# Patient Record
Sex: Female | Born: 1970 | Race: White | Hispanic: No | Smoking: Current every day smoker
Health system: Southern US, Community
[De-identification: ages and names within clinical notes are randomized; demographics above are authoritative.]

## PROBLEM LIST (undated history)

## (undated) DIAGNOSIS — E785 Hyperlipidemia, unspecified: Secondary | ICD-10-CM

## (undated) DIAGNOSIS — K219 Gastro-esophageal reflux disease without esophagitis: Secondary | ICD-10-CM

## (undated) DIAGNOSIS — E1121 Type 2 diabetes mellitus with diabetic nephropathy: Secondary | ICD-10-CM

## (undated) DIAGNOSIS — L03311 Cellulitis of abdominal wall: Secondary | ICD-10-CM

## (undated) DIAGNOSIS — E039 Hypothyroidism, unspecified: Secondary | ICD-10-CM

## (undated) DIAGNOSIS — N189 Chronic kidney disease, unspecified: Secondary | ICD-10-CM

## (undated) DIAGNOSIS — Z720 Tobacco use: Secondary | ICD-10-CM

## (undated) DIAGNOSIS — I10 Essential (primary) hypertension: Secondary | ICD-10-CM

## (undated) DIAGNOSIS — F32A Depression, unspecified: Secondary | ICD-10-CM

## (undated) DIAGNOSIS — F329 Major depressive disorder, single episode, unspecified: Secondary | ICD-10-CM

## (undated) DIAGNOSIS — Z803 Family history of malignant neoplasm of breast: Secondary | ICD-10-CM

## (undated) DIAGNOSIS — R809 Proteinuria, unspecified: Secondary | ICD-10-CM

## (undated) DIAGNOSIS — F419 Anxiety disorder, unspecified: Secondary | ICD-10-CM

## (undated) DIAGNOSIS — K589 Irritable bowel syndrome without diarrhea: Secondary | ICD-10-CM

## (undated) HISTORY — DX: Essential (primary) hypertension: I10

## (undated) HISTORY — DX: Irritable bowel syndrome, unspecified: K58.9

## (undated) HISTORY — DX: Morbid (severe) obesity due to excess calories: E66.01

## (undated) HISTORY — DX: Depression, unspecified: F32.A

## (undated) HISTORY — DX: Major depressive disorder, single episode, unspecified: F32.9

## (undated) HISTORY — DX: Hypothyroidism, unspecified: E03.9

## (undated) HISTORY — DX: Gastro-esophageal reflux disease without esophagitis: K21.9

## (undated) HISTORY — DX: Tobacco use: Z72.0

## (undated) HISTORY — DX: Chronic kidney disease, unspecified: N18.9

## (undated) HISTORY — DX: Hyperlipidemia, unspecified: E78.5

## (undated) HISTORY — DX: Type 2 diabetes mellitus with diabetic nephropathy: E11.21

## (undated) HISTORY — DX: Family history of malignant neoplasm of breast: Z80.3

## (undated) HISTORY — PX: OTHER SURGICAL HISTORY: SHX169

---

## 2003-03-27 HISTORY — PX: CHOLECYSTECTOMY: SHX55

## 2005-02-18 ENCOUNTER — Other Ambulatory Visit: Payer: Self-pay

## 2005-02-18 ENCOUNTER — Emergency Department: Payer: Self-pay | Admitting: Emergency Medicine

## 2005-09-12 ENCOUNTER — Emergency Department: Payer: Self-pay | Admitting: Emergency Medicine

## 2007-09-12 ENCOUNTER — Ambulatory Visit: Payer: Self-pay | Admitting: Internal Medicine

## 2008-05-11 ENCOUNTER — Ambulatory Visit: Payer: Self-pay | Admitting: Internal Medicine

## 2008-08-22 ENCOUNTER — Emergency Department: Payer: Self-pay | Admitting: Emergency Medicine

## 2008-08-23 ENCOUNTER — Emergency Department: Payer: Self-pay | Admitting: Emergency Medicine

## 2009-03-26 HISTORY — PX: COLONOSCOPY: SHX174

## 2009-11-07 ENCOUNTER — Ambulatory Visit: Payer: Self-pay | Admitting: Unknown Physician Specialty

## 2010-03-26 HISTORY — PX: OTHER SURGICAL HISTORY: SHX169

## 2010-07-25 ENCOUNTER — Ambulatory Visit: Payer: Self-pay | Admitting: Urology

## 2010-07-31 ENCOUNTER — Ambulatory Visit: Payer: Self-pay | Admitting: Urology

## 2010-08-03 ENCOUNTER — Ambulatory Visit: Payer: Self-pay | Admitting: Urology

## 2013-02-23 ENCOUNTER — Ambulatory Visit: Payer: Self-pay | Admitting: Family Medicine

## 2014-01-26 ENCOUNTER — Emergency Department: Payer: Self-pay | Admitting: Emergency Medicine

## 2014-01-26 DIAGNOSIS — E785 Hyperlipidemia, unspecified: Secondary | ICD-10-CM | POA: Insufficient documentation

## 2014-01-26 DIAGNOSIS — Z8669 Personal history of other diseases of the nervous system and sense organs: Secondary | ICD-10-CM | POA: Insufficient documentation

## 2014-01-26 DIAGNOSIS — F17200 Nicotine dependence, unspecified, uncomplicated: Secondary | ICD-10-CM | POA: Insufficient documentation

## 2014-01-26 DIAGNOSIS — E1129 Type 2 diabetes mellitus with other diabetic kidney complication: Secondary | ICD-10-CM | POA: Insufficient documentation

## 2014-01-26 DIAGNOSIS — Z87442 Personal history of urinary calculi: Secondary | ICD-10-CM | POA: Insufficient documentation

## 2014-01-26 DIAGNOSIS — E039 Hypothyroidism, unspecified: Secondary | ICD-10-CM | POA: Insufficient documentation

## 2014-01-26 DIAGNOSIS — I10 Essential (primary) hypertension: Secondary | ICD-10-CM | POA: Insufficient documentation

## 2014-01-26 DIAGNOSIS — R809 Proteinuria, unspecified: Secondary | ICD-10-CM | POA: Insufficient documentation

## 2014-07-20 ENCOUNTER — Emergency Department
Admit: 2014-07-20 | Disposition: A | Discharge status: Home / Self Care | Payer: Self-pay | Admitting: Emergency Medicine

## 2014-07-20 LAB — CBC
HCT: 41 % (ref 35.0–47.0)
HGB: 13.4 g/dL (ref 12.0–16.0)
MCH: 27.4 pg (ref 26.0–34.0)
MCHC: 32.6 g/dL (ref 32.0–36.0)
MCV: 84 fL (ref 80–100)
PLATELETS: 262 10*3/uL (ref 150–440)
RBC: 4.88 10*6/uL (ref 3.80–5.20)
RDW: 17.8 % — ABNORMAL HIGH (ref 11.5–14.5)
WBC: 11 10*3/uL (ref 3.6–11.0)

## 2014-07-20 LAB — COMPREHENSIVE METABOLIC PANEL
ALT: 21 U/L
AST: 18 U/L
Albumin: 3.4 g/dL — ABNORMAL LOW
Alkaline Phosphatase: 130 U/L — ABNORMAL HIGH
Anion Gap: 8 (ref 7–16)
BUN: 17 mg/dL
Bilirubin,Total: 0.6 mg/dL
CALCIUM: 9.2 mg/dL
CHLORIDE: 107 mmol/L
CO2: 26 mmol/L
Creatinine: 1.15 mg/dL — ABNORMAL HIGH
EGFR (Non-African Amer.): 59 — ABNORMAL LOW
GLUCOSE: 179 mg/dL — AB
POTASSIUM: 4 mmol/L
Sodium: 141 mmol/L
Total Protein: 7 g/dL

## 2014-07-20 LAB — TROPONIN I: Troponin-I: <0.03 ng/mL

## 2014-07-20 LAB — CK TOTAL AND CKMB (NOT AT ARMC)
CK, Total: 62 U/L
CK-MB: 1.9 ng/mL

## 2014-07-20 LAB — D-DIMER(ARMC): D-DIMER: 432 ng/mL

## 2014-07-21 ENCOUNTER — Encounter: Payer: Self-pay | Admitting: *Deleted

## 2014-07-26 ENCOUNTER — Ambulatory Visit (INDEPENDENT_AMBULATORY_CARE_PROVIDER_SITE_OTHER): Payer: 59 | Admitting: General Surgery

## 2014-07-26 ENCOUNTER — Encounter: Payer: Self-pay | Admitting: General Surgery

## 2014-07-26 VITALS — BP 122/68 | HR 76 | Resp 14 | Ht 61.0 in | Wt 291.0 lb

## 2014-07-26 DIAGNOSIS — L0231 Cutaneous abscess of buttock: Secondary | ICD-10-CM

## 2014-07-26 NOTE — Patient Instructions (Addendum)
Moist heat to area 3 x daily. Finish current course of ABX. Patient to return in two weeks

## 2014-07-26 NOTE — Progress Notes (Signed)
Patient ID: Kendra Anderson, female   DOB: 01/27/1971, 44 y.o.   MRN: [id number]  Chief Complaint  Patient presents with  . Other    pilonidal cyst    HPI Kendra NakayamaKimberly D Anderson is a 44 y.o. female here today for a evaluation of a pilonidal cyst . Patient states she noticed about two weeks ago. She went to her primary doctors on 07/22/14 and they drained the area and out-started  her on amoxicillin 875. She states the area is still draining. Patient states there is another spot above the rea that they drained. HPI  Past Medical History  Diagnosis Date  . Diabetes mellitus without complication   . Hypertension   . Hyperlipidemia   . GERD (gastroesophageal reflux disease)   . Chronic kidney disease     Past Surgical History  Procedure Laterality Date  . Kidney stones removed  2012  . Cholecystectomy  2005  . Colonoscopy  2011    Dr. Mechele Collinelliott    History reviewed. No pertinent family history.  Social History History  Substance Use Topics  . Smoking status: Current Every Day Smoker -- 1.00 packs/day for 12 years    Types: Cigarettes  . Smokeless tobacco: Not on file  . Alcohol Use: No    No Known Allergies  Current Outpatient Prescriptions  Medication Sig Dispense Refill  . amLODipine (NORVASC) 10 MG tablet     . amoxicillin-clavulanate (AUGMENTIN) 875-125 MG per tablet     . atorvastatin (LIPITOR) 20 MG tablet     . enalapril (VASOTEC) 10 MG tablet     . fluticasone (FLONASE) 50 MCG/ACT nasal spray     . glimepiride (AMARYL) 2 MG tablet     . LEVEMIR FLEXTOUCH 100 UNIT/ML Pen     . levothyroxine (SYNTHROID, LEVOTHROID) 112 MCG tablet     . metFORMIN (GLUCOPHAGE) 1000 MG tablet     . NOVOLOG FLEXPEN 100 UNIT/ML FlexPen     . omeprazole (PRILOSEC) 40 MG capsule     . VICTOZA 18 MG/3ML SOPN      No current facility-administered medications for this visit.    Review of Systems Review of Systems  Constitutional: Negative.   Respiratory: Negative.   Cardiovascular:  Negative.     Blood pressure 122/68, pulse 76, resp. rate 14, height 5\' 1"  (1.549 m), weight 291 lb (131.997 kg), last menstrual period 06/28/2014.  Physical Exam Physical Exam  Constitutional: She is oriented to person, place, and time. She appears well-developed and well-nourished.  Neurological: She is alert and oriented to person, place, and time.  Skin:      Right upper gluteal abscess about 4 cm size  Data Reviewed none  Assessment    4cm right gluteal abscess, appears to be draining well.     Plan    Continue with current ABX. Local heat. Patient to return 2 weeks.        SANKAR,SEEPLAPUTHUR G 07/26/2014, 1:13 PM

## 2014-08-09 ENCOUNTER — Encounter: Payer: Self-pay | Admitting: General Surgery

## 2014-08-09 ENCOUNTER — Ambulatory Visit (INDEPENDENT_AMBULATORY_CARE_PROVIDER_SITE_OTHER): Payer: 59 | Admitting: General Surgery

## 2014-08-09 VITALS — BP 120/68 | HR 76 | Resp 14 | Ht 61.0 in | Wt 261.0 lb

## 2014-08-09 DIAGNOSIS — L0231 Cutaneous abscess of buttock: Secondary | ICD-10-CM

## 2014-08-09 NOTE — Patient Instructions (Signed)
Patient to return in one month.  The patient is aware to use a heating pad as needed for comfort. 

## 2014-08-09 NOTE — Progress Notes (Signed)
Patient ID: Kendra Anderson, female   DOB: 02/08/1971, 44 y.o.   MRN: [id number]  Chief Complaint  Patient presents with  . Follow-up    gluteal abscess    HPI Kendra Anderson is a 44 y.o. female here today for her follow up gluteal abscess . Patient states the area is doing well. No more drainage.  HPI  Past Medical History  Diagnosis Date  . Diabetes mellitus without complication   . Hypertension   . Hyperlipidemia   . GERD (gastroesophageal reflux disease)   . Chronic kidney disease     Past Surgical History  Procedure Laterality Date  . Kidney stones removed  2012  . Cholecystectomy  2005  . Colonoscopy  2011    Dr. Mechele Collinelliott    History reviewed. No pertinent family history.  Social History History  Substance Use Topics  . Smoking status: Current Every Day Smoker -- 1.00 packs/day for 12 years    Types: Cigarettes  . Smokeless tobacco: Not on file  . Alcohol Use: No    No Known Allergies  Current Outpatient Prescriptions  Medication Sig Dispense Refill  . amLODipine (NORVASC) 10 MG tablet     . amoxicillin-clavulanate (AUGMENTIN) 875-125 MG per tablet     . atorvastatin (LIPITOR) 20 MG tablet     . enalapril (VASOTEC) 10 MG tablet     . fluticasone (FLONASE) 50 MCG/ACT nasal spray     . glimepiride (AMARYL) 2 MG tablet     . LEVEMIR FLEXTOUCH 100 UNIT/ML Pen     . levothyroxine (SYNTHROID, LEVOTHROID) 112 MCG tablet     . metFORMIN (GLUCOPHAGE) 1000 MG tablet     . NOVOLOG FLEXPEN 100 UNIT/ML FlexPen     . omeprazole (PRILOSEC) 40 MG capsule     . VICTOZA 18 MG/3ML SOPN      No current facility-administered medications for this visit.    Review of Systems Review of Systems  Constitutional: Negative.   Respiratory: Negative.   Cardiovascular: Negative.     Blood pressure 120/68, pulse 76, resp. rate 14, height 5\' 1"  (1.549 m), weight 261 lb (118.389 kg), last menstrual period 06/28/2014.  Physical Exam Physical Exam In right upper gluteal  area there is no redness or skin induration. Drainage site is healed. Above there is a 3by2 cm firm subcutaneous mass, non tender.Marland Kitchen.other findings in the gluteal region No  Data Reviewed None  Assessment    Gluteal abscess- likely resolving inflammation, no abscess noted.     Plan       Patient to return in three to four weeks.     Caidon Foti G 08/09/2014, 10:34 AM

## 2014-09-02 ENCOUNTER — Encounter: Payer: Self-pay | Admitting: General Surgery

## 2014-09-02 ENCOUNTER — Ambulatory Visit (INDEPENDENT_AMBULATORY_CARE_PROVIDER_SITE_OTHER): Payer: 59 | Admitting: General Surgery

## 2014-09-02 VITALS — BP 142/78 | HR 72 | Resp 14 | Ht 61.0 in | Wt 265.0 lb

## 2014-09-02 DIAGNOSIS — L0231 Cutaneous abscess of buttock: Secondary | ICD-10-CM

## 2014-09-02 MED ORDER — SULFAMETHOXAZOLE-TRIMETHOPRIM 800-160 MG PO TABS: 1.0000 | ORAL_TABLET | Freq: Two times a day (BID) | ORAL | Status: DC

## 2014-09-02 NOTE — Progress Notes (Signed)
Patient first seen for abscess on 07/26/14 was doing better. Patient states the abscess increased in size two days ago, had low grade fever and chills. She started to feel better last night after it drained. She feels a "knot" in the area of abscess.  2 cm induration on the right gluteal region in the lower inner quadrant. Induration has soft middle and 5 mm of thinned out skin, which was opened with a needle. No pus seen.   Impression: recurrent small abscess in gluteal area. Appears to have drained fully. Covered with dry gauze.  Rx for Septra DS 1 BID for 1 week.   Follow up as needed.   PCP: Mila Merry

## 2014-09-16 ENCOUNTER — Ambulatory Visit: Payer: 59 | Admitting: General Surgery

## 2014-11-01 ENCOUNTER — Encounter: Payer: Self-pay | Admitting: Family Medicine

## 2014-11-01 ENCOUNTER — Ambulatory Visit (INDEPENDENT_AMBULATORY_CARE_PROVIDER_SITE_OTHER): Payer: 59 | Admitting: Family Medicine

## 2014-11-01 VITALS — BP 112/70 | HR 80 | Temp 98.8°F | Resp 17 | Ht 61.0 in | Wt 252.0 lb

## 2014-11-01 DIAGNOSIS — L03317 Cellulitis of buttock: Secondary | ICD-10-CM

## 2014-11-01 MED ORDER — AMOXICILLIN-POT CLAVULANATE 875-125 MG PO TABS: 1.0000 | ORAL_TABLET | Freq: Two times a day (BID) | ORAL | Status: DC

## 2014-11-01 NOTE — Progress Notes (Signed)
       Patient: Kendra Anderson Female    DOB: Oct 08, 1970   44 y.o.   MRN: [id number] Visit Date: 11/01/2014  Today's Provider: Mila Merry, MD   Chief Complaint  Patient presents with  . Recurrent Skin Infections   Subjective:    HPI  Recurrent boil on right buttock, patient was seen 07/22/2014 by Molly Maduro for boil. Boil was lanced and prescribed Augmentin Culture grew Klebsiella pneumonia, and lesions quickly resolved with antibiotic. Boil became infected again 1 week ago. Patient has had low-grade fever with chills.    No Known Allergies Previous Medications   AMLODIPINE (NORVASC) 10 MG TABLET    10 mg.    ATORVASTATIN (LIPITOR) 20 MG TABLET    Take 20 mg by mouth daily.    ENALAPRIL (VASOTEC) 10 MG TABLET    Take 10 mg by mouth daily.    FLUTICASONE (FLONASE) 50 MCG/ACT NASAL SPRAY       GLIMEPIRIDE (AMARYL) 2 MG TABLET    Take 2 mg by mouth 2 (two) times daily.    LEVEMIR FLEXTOUCH 100 UNIT/ML PEN       LEVOTHYROXINE (SYNTHROID, LEVOTHROID) 112 MCG TABLET    Take 112 mcg by mouth daily before breakfast.    METFORMIN (GLUCOPHAGE) 1000 MG TABLET    Take 1,000 mg by mouth 2 (two) times daily with a meal.    MULTIPLE VITAMINS-MINERALS (MULTIVITAL PO)    Take 1 capsule by mouth daily.   NOVOLOG FLEXPEN 100 UNIT/ML FLEXPEN       OMEPRAZOLE (PRILOSEC) 40 MG CAPSULE    Take 40 mg by mouth daily.    VICTOZA 18 MG/3ML SOPN        Review of Systems  Constitutional: Positive for fever and chills.  Cardiovascular: Negative for chest pain and palpitations.  Neurological: Negative for dizziness, light-headedness and headaches.    History  Substance Use Topics  . Smoking status: Current Every Day Smoker -- 1.00 packs/day for 12 years    Types: Cigarettes  . Smokeless tobacco: Not on file  . Alcohol Use: No   Objective:   BP 112/70 mmHg  Pulse 80  Temp(Src) 98.8 F (37.1 C) (Oral)  Resp 17  Ht  (1.549 m)  Wt 252 lb (114.306 kg)  BMI 47.64 kg/m2  SpO2 100%  Physical  Exam  About 1cm shallow erosion with small amount yellow drainage right buttocks with about 1mm surrounding erythema.  .    Assessment & Plan:     1. Cellulitis of buttock  - amoxicillin-clavulanate (AUGMENTIN) 875-125 MG per tablet; Take 1 tablet by mouth 2 (two) times daily.  Dispense: 20 tablet; Refill: 0        Mila Merry, MD  [facility]

## 2014-12-17 DIAGNOSIS — N183 Chronic kidney disease, stage 3 unspecified: Secondary | ICD-10-CM | POA: Insufficient documentation

## 2015-04-27 ENCOUNTER — Encounter: Payer: Self-pay | Admitting: Family Medicine

## 2015-04-27 ENCOUNTER — Ambulatory Visit (INDEPENDENT_AMBULATORY_CARE_PROVIDER_SITE_OTHER): Payer: 59 | Admitting: Family Medicine

## 2015-04-27 ENCOUNTER — Telehealth: Payer: Self-pay | Admitting: Family Medicine

## 2015-04-27 VITALS — BP 110/60 | HR 83 | Temp 97.7°F | Resp 16 | Ht 61.0 in | Wt 237.0 lb

## 2015-04-27 DIAGNOSIS — L02211 Cutaneous abscess of abdominal wall: Secondary | ICD-10-CM | POA: Diagnosis not present

## 2015-04-27 DIAGNOSIS — R109 Unspecified abdominal pain: Secondary | ICD-10-CM | POA: Diagnosis not present

## 2015-04-27 MED ORDER — AMOXICILLIN-POT CLAVULANATE 875-125 MG PO TABS: 1.0000 | ORAL_TABLET | Freq: Two times a day (BID) | ORAL | Status: DC

## 2015-04-27 NOTE — Telephone Encounter (Signed)
Confirmed that pharmacy did receive rx. Patient notified.

## 2015-04-27 NOTE — Telephone Encounter (Signed)
Pt came in this am and thought she was getting a rx for Augmentin to be sent to [address] but she says nothing has been sent over yet.  Please advise.  Her call back is [phone]  Thanks Barth Kirks

## 2015-04-27 NOTE — Telephone Encounter (Signed)
Please review. Thanks!  

## 2015-04-27 NOTE — Telephone Encounter (Signed)
Please have her check again, rx should be there.

## 2015-04-27 NOTE — Progress Notes (Signed)
       Patient: Kendra Anderson Female    DOB: 1970-05-22   45 y.o.   MRN: [id number] Visit Date: 04/27/2015  Today's Provider: Mila Merry, MD   Chief Complaint  Patient presents with  . Cyst   Subjective:    HPI  Patient noticed boil on right side of her lower belly, pelvic area, 04/25/15 . Boil is the size of a walnut, red and is painful, and getting larger Patient reports having low-grade fever, body aches and chills since 04/25/2015. She states she has several similar lesions in the past which usually require incision and drainage.   No Known Allergies Previous Medications   AMLODIPINE (NORVASC) 10 MG TABLET    10 mg.    ATORVASTATIN (LIPITOR) 20 MG TABLET    Take 20 mg by mouth daily.    ENALAPRIL (VASOTEC) 10 MG TABLET    Take 10 mg by mouth daily.    GLIMEPIRIDE (AMARYL) 2 MG TABLET    Take 2 mg by mouth 2 (two) times daily.    LEVOTHYROXINE (SYNTHROID, LEVOTHROID) 112 MCG TABLET    Take 112 mcg by mouth daily before breakfast.    METFORMIN (GLUCOPHAGE) 1000 MG TABLET    Take 1,000 mg by mouth 2 (two) times daily with a meal.    MULTIPLE VITAMINS-MINERALS (MULTIVITAL PO)    Take 1 capsule by mouth daily.   OMEPRAZOLE (PRILOSEC) 40 MG CAPSULE    Take 40 mg by mouth daily.    VICTOZA 18 MG/3ML SOPN        Review of Systems  Constitutional: Positive for fever and chills. Negative for appetite change and fatigue.  Respiratory: Negative for chest tightness and shortness of breath.   Cardiovascular: Negative for chest pain and palpitations.  Gastrointestinal: Negative for nausea, vomiting and abdominal pain.  Musculoskeletal: Positive for myalgias.  Neurological: Negative for dizziness and weakness.    Social History  Substance Use Topics  . Smoking status: Current Every Day Smoker -- 1.00 packs/day for 12 years    Types: Cigarettes  . Smokeless tobacco: Not on file  . Alcohol Use: No   Objective:   BP 110/60 mmHg  Pulse 83  Temp(Src) 97.7 F (36.5 C) (Oral)   Resp 16  Ht  (1.549 m)  Wt 237 lb (107.502 kg)  BMI 44.80 kg/m2  SpO2 100%  LMP 04/13/2015  Physical Exam  Integ: About 4cm x 3 cm red cystic lesion pannus of right side of abdomen, no drainage. Very tender.     Assessment & Plan:     1. Abscess of abdominal wall Prepped with isopropyl alcohol. Anesthetized with SQ injection of 2% Lidocaine with epinephrine. Made 1cm incision allowing copious purulent material to drain and packed with 1cm long piece of iodoform guaze which she is to removed in 1-2 days.       Call if no rapidly improving.   Mila Merry, MD  [facility]

## 2015-12-05 ENCOUNTER — Encounter: Payer: Self-pay | Admitting: Family Medicine

## 2015-12-05 ENCOUNTER — Ambulatory Visit (INDEPENDENT_AMBULATORY_CARE_PROVIDER_SITE_OTHER): Payer: 59 | Admitting: Family Medicine

## 2015-12-05 VITALS — BP 110/64 | HR 84 | Temp 98.3°F | Resp 18 | Wt 229.0 lb

## 2015-12-05 DIAGNOSIS — L02415 Cutaneous abscess of right lower limb: Secondary | ICD-10-CM | POA: Diagnosis not present

## 2015-12-05 MED ORDER — AMOXICILLIN-POT CLAVULANATE 875-125 MG PO TABS: 1.0000 | ORAL_TABLET | Freq: Two times a day (BID) | ORAL | 0 refills | Status: AC

## 2015-12-05 NOTE — Progress Notes (Signed)
Patient: Kendra Anderson Female    DOB: 21-Sep-1970   45 y.o.   MRN: [id number] Visit Date: 12/05/2015  Today's Provider: Mila Merry, MD   Chief Complaint  Patient presents with  . Mass   Subjective:    HPI  Mass:  Patient comes in today reporting she has a boil on her right inner thigh that appeared 4 days ago. Patient states the mass is extremely painful. Patient denies any drainage, but report the mass is red and purple colored. Patient has tried sitting in a bath tub and warm compresses with no relief.  Patient has had low grade fever and chills. She has had several swollen sebaceous cysts in the past requiring I&D and she wishes to proceed with I & D today.      Allergies  Allergen Reactions  . Isometheptene-Dichloral-Apap Hives    MIDRIN     Current Outpatient Prescriptions:  .  amLODipine (NORVASC) 10 MG tablet, 10 mg. , Disp: , Rfl:  .  atorvastatin (LIPITOR) 20 MG tablet, Take 20 mg by mouth daily. , Disp: , Rfl:  .  enalapril (VASOTEC) 10 MG tablet, Take 10 mg by mouth daily. , Disp: , Rfl:  .  glimepiride (AMARYL) 2 MG tablet, Take 2 mg by mouth 2 (two) times daily. , Disp: , Rfl:  .  Insulin Human (INSULIN PUMP) SOLN, Inject into the skin., Disp: , Rfl:  .  levothyroxine (SYNTHROID, LEVOTHROID) 112 MCG tablet, Take 112 mcg by mouth daily before breakfast. , Disp: , Rfl:  .  metFORMIN (GLUCOPHAGE) 1000 MG tablet, Take 1,000 mg by mouth 2 (two) times daily with a meal. , Disp: , Rfl:  .  Multiple Vitamins-Minerals (MULTIVITAL PO), Take 1 capsule by mouth daily., Disp: , Rfl:  .  omeprazole (PRILOSEC) 40 MG capsule, Take 40 mg by mouth daily. , Disp: , Rfl:  .  VICTOZA 18 MG/3ML SOPN, , Disp: , Rfl:   Current Outpatient Prescriptions  Medication Sig Dispense Refill  . amLODipine (NORVASC) 10 MG tablet 10 mg.     . atorvastatin (LIPITOR) 20 MG tablet Take 20 mg by mouth daily.     . enalapril (VASOTEC) 10 MG tablet Take 10 mg by mouth daily.     Marland Kitchen  glimepiride (AMARYL) 2 MG tablet Take 2 mg by mouth 2 (two) times daily.     . Insulin Human (INSULIN PUMP) SOLN Inject into the skin.    Marland Kitchen levothyroxine (SYNTHROID, LEVOTHROID) 112 MCG tablet Take 112 mcg by mouth daily before breakfast.     . metFORMIN (GLUCOPHAGE) 1000 MG tablet Take 1,000 mg by mouth 2 (two) times daily with a meal.     . Multiple Vitamins-Minerals (MULTIVITAL PO) Take 1 capsule by mouth daily.    Marland Kitchen omeprazole (PRILOSEC) 40 MG capsule Take 40 mg by mouth daily.     Marland Kitchen VICTOZA 18 MG/3ML SOPN      No current facility-administered medications for this visit.     Review of Systems  Constitutional: Positive for chills and fever. Negative for appetite change and fatigue.  Respiratory: Negative for chest tightness and shortness of breath.   Cardiovascular: Negative for chest pain and palpitations.  Gastrointestinal: Negative for abdominal pain, nausea and vomiting.  Skin: Positive for color change.       Mass on inner thigh if right leg  Neurological: Negative for dizziness and weakness.    Social History  Substance Use Topics  . Smoking status:  Current Every Day Smoker    Packs/day: 0.50    Years: 12.00    Types: Cigarettes  . Smokeless tobacco: Never Used  . Alcohol use No   Objective:   BP 110/64 (BP Location: Left Arm, Patient Position: Sitting, Cuff Size: Large)   Pulse 84   Temp 98.3 F (36.8 C) (Oral)   Resp 18   Wt 229 lb (103.9 kg)   SpO2 100% Comment: room air  BMI 43.27 kg/m   Physical Exam  General appearance: alert, well developed, well nourished, cooperative and in no distress Integ: grapes sized tender red cystic mass right inner thigh with scabbed area over previous area of drainage.     Assessment & Plan:     1. Abscess of right thigh Prepped with isopropyl alcohol. Anesthetized with 1% lidocaine with epinephrine. Made a 1cm incision and expressed copious purulent material from abscess. Packed with iodoform gauze and applied pressure  dressing.Patient tolerated well with minimal bleeding. Advised to keep area clean and dry for next 24 hours. Replace dressing if it becomes soaked with blood or wet. Return in 2-3 days for packing change.   - amoxicillin-clavulanate (AUGMENTIN) 875-125 MG tablet; Take 1 tablet by mouth 2 (two) times daily.  Dispense: 14 tablet; Refill: 0       Mila Merryonald Delan Ksiazek, MD  [facility]

## 2015-12-07 ENCOUNTER — Ambulatory Visit (INDEPENDENT_AMBULATORY_CARE_PROVIDER_SITE_OTHER): Payer: 59 | Admitting: Family Medicine

## 2015-12-07 ENCOUNTER — Encounter: Payer: Self-pay | Admitting: Family Medicine

## 2015-12-07 VITALS — BP 102/62 | HR 80 | Temp 97.8°F | Resp 16 | Wt 231.0 lb

## 2015-12-07 DIAGNOSIS — L02415 Cutaneous abscess of right lower limb: Secondary | ICD-10-CM

## 2015-12-07 NOTE — Progress Notes (Signed)
       Patient: Kendra Anderson Female    DOB: 03/01/1971   45 y.o.   MRN: [id number] Visit Date: 12/07/2015  Today's Provider: Mila Merryonald Bonnye Halle, MD   Chief Complaint  Patient presents with  . Follow-up    abscess   Subjective:    HPI  Follow up for Abscess  The patient was last seen for this 2 days ago. Changes made at last visit include Performed I & D. Packed with iodoform gauze and applied pressure dressing. Prescribed augmentin BID She reports excellent compliance with treatment. She feels that condition is Improved. She is not having side effects.   ------------------------------------------------------------------------------------       Allergies  Allergen Reactions  . Isometheptene-Dichloral-Apap Hives    MIDRIN     Current Outpatient Prescriptions:  .  amLODipine (NORVASC) 10 MG tablet, 10 mg. , Disp: , Rfl:  .  amoxicillin-clavulanate (AUGMENTIN) 875-125 MG tablet, Take 1 tablet by mouth 2 (two) times daily., Disp: 14 tablet, Rfl: 0 .  atorvastatin (LIPITOR) 20 MG tablet, Take 20 mg by mouth daily. , Disp: , Rfl:  .  enalapril (VASOTEC) 10 MG tablet, Take 10 mg by mouth daily. , Disp: , Rfl:  .  glimepiride (AMARYL) 2 MG tablet, Take 2 mg by mouth 2 (two) times daily. , Disp: , Rfl:  .  Insulin Human (INSULIN PUMP) SOLN, Inject into the skin., Disp: , Rfl:  .  levothyroxine (SYNTHROID, LEVOTHROID) 112 MCG tablet, Take 112 mcg by mouth daily before breakfast. , Disp: , Rfl:  .  metFORMIN (GLUCOPHAGE) 1000 MG tablet, Take 1,000 mg by mouth 2 (two) times daily with a meal. , Disp: , Rfl:  .  Multiple Vitamins-Minerals (MULTIVITAL PO), Take 1 capsule by mouth daily., Disp: , Rfl:  .  omeprazole (PRILOSEC) 40 MG capsule, Take 40 mg by mouth daily. , Disp: , Rfl:  .  VICTOZA 18 MG/3ML SOPN, , Disp: , Rfl:   Review of Systems  Constitutional: Negative for chills, diaphoresis, fatigue and fever.    Social History  Substance Use Topics  . Smoking status:  Current Every Day Smoker    Packs/day: 0.50    Years: 12.00    Types: Cigarettes  . Smokeless tobacco: Never Used  . Alcohol use No   Objective:   BP 102/62 (BP Location: Right Arm, Patient Position: Sitting, Cuff Size: Large)   Pulse 80   Temp 97.8 F (36.6 C) (Oral)   Resp 16   Wt 231 lb (104.8 kg)   SpO2 99%   BMI 43.65 kg/m   Physical Exam    Wound is clean with. Dressing is soaked with purulent fluid. Slightly tender around incision but abscess is not palpable. No surrounding erythema. No drainage after removal of packing.  Assessment & Plan:     Abscess of right thigh  Two days post I & D and healing very well with no signs of infection at wound site. Replace iodoform packing. To allow another two more days to drain. Finish Augmentin. Patient may remove bandage and packing after two more days.       Mila Merryonald Salah Burlison, MD  [facility]

## 2016-04-10 LAB — HEMOGLOBIN A1C: HEMOGLOBIN A1C: 6.2

## 2016-07-05 ENCOUNTER — Other Ambulatory Visit: Payer: Self-pay

## 2016-07-05 ENCOUNTER — Other Ambulatory Visit: Payer: Self-pay | Admitting: Certified Nurse Midwife

## 2016-07-05 DIAGNOSIS — Z308 Encounter for other contraceptive management: Secondary | ICD-10-CM

## 2016-07-05 DIAGNOSIS — Z3042 Encounter for surveillance of injectable contraceptive: Secondary | ICD-10-CM

## 2016-07-05 MED ORDER — MEDROXYPROGESTERONE ACETATE 150 MG/ML IM SUSP: 150.0000 mg | INTRAMUSCULAR | 0 refills | Status: DC

## 2016-07-05 MED ORDER — MEDROXYPROGESTERONE ACETATE 150 MG/ML IM SUSP: 150.0000 mg | Freq: Once | INTRAMUSCULAR | Status: DC

## 2016-07-05 NOTE — Telephone Encounter (Signed)
Pt is schedule 07/13/16 for injection. Pt states she is out of her injection and needs an refill. Pt isn't due for annual yet could an refill to [facility] [address] CB# [phone]

## 2016-07-05 NOTE — Telephone Encounter (Signed)
Sent one refill to [facility] [address]. Pt aware.

## 2016-07-13 ENCOUNTER — Ambulatory Visit (INDEPENDENT_AMBULATORY_CARE_PROVIDER_SITE_OTHER): Payer: 59

## 2016-07-13 DIAGNOSIS — Z3042 Encounter for surveillance of injectable contraceptive: Secondary | ICD-10-CM

## 2016-07-13 MED ORDER — MEDROXYPROGESTERONE ACETATE 150 MG/ML IM SUSP
150.0000 mg | Freq: Once | INTRAMUSCULAR | Status: AC
  Administered 2016-07-13: 150 mg via INTRAMUSCULAR

## 2016-08-10 ENCOUNTER — Ambulatory Visit: Payer: 59 | Admitting: Certified Nurse Midwife

## 2016-09-12 ENCOUNTER — Encounter: Payer: Self-pay | Admitting: Certified Nurse Midwife

## 2016-09-12 ENCOUNTER — Ambulatory Visit (INDEPENDENT_AMBULATORY_CARE_PROVIDER_SITE_OTHER): Payer: 59 | Admitting: Certified Nurse Midwife

## 2016-09-12 VITALS — BP 130/70 | HR 88 | Ht 61.0 in | Wt 259.0 lb

## 2016-09-12 DIAGNOSIS — Z124 Encounter for screening for malignant neoplasm of cervix: Secondary | ICD-10-CM

## 2016-09-12 DIAGNOSIS — Z3042 Encounter for surveillance of injectable contraceptive: Secondary | ICD-10-CM

## 2016-09-12 DIAGNOSIS — Z113 Encounter for screening for infections with a predominantly sexual mode of transmission: Secondary | ICD-10-CM

## 2016-09-12 DIAGNOSIS — Z716 Tobacco abuse counseling: Secondary | ICD-10-CM

## 2016-09-12 DIAGNOSIS — Z1239 Encounter for other screening for malignant neoplasm of breast: Secondary | ICD-10-CM

## 2016-09-12 DIAGNOSIS — E039 Hypothyroidism, unspecified: Secondary | ICD-10-CM | POA: Insufficient documentation

## 2016-09-12 DIAGNOSIS — Z1231 Encounter for screening mammogram for malignant neoplasm of breast: Secondary | ICD-10-CM

## 2016-09-12 DIAGNOSIS — Z01419 Encounter for gynecological examination (general) (routine) without abnormal findings: Secondary | ICD-10-CM

## 2016-09-12 MED ORDER — MEDROXYPROGESTERONE ACETATE 150 MG/ML IM SUSP
150.0000 mg | INTRAMUSCULAR | 3 refills | Status: DC
Start: 2016-10-10 — End: 2017-06-17

## 2016-09-12 NOTE — Progress Notes (Signed)
Gynecology Annual Exam  PCP: Malva Limes, MD  Chief Complaint:  Chief Complaint  Patient presents with  . Gynecologic Exam    History of Present Illness:Kendra Anderson is a 46 year old Caucasian/White female, G0 P0000, who presents for her annual exam. She is having no significant GYN problems.  Her menses are absent on Depo Provera. She has had no spotting.  She denies dysmenorrhea.  The patient's past medical history is notable for a history of DM Type II, morbid obesity, hypothyroidism, and hypertension. Dr Tedd Sias is her endocrinologist and Dr Cherylann Ratel is her internist/kidney doctor.. Dr Chandra Batch is her PCP.  Since her last annual GYN exam dated 09/07/2015, she has regained 29# and has not been exercising as she had the previous year, losing 37#. Had an I&D of a right thigh abscess last year and was recently treated for bronchitis. She is sexually active. She is currently using depo provera for contraception. Last dose was 07/13/2016 Her most recent pap smear was obtained 09/07/2015 and was with negative cells and negative HPV DNA.  Her most recent mammogram obtained on 09/03/2014 was normal and revealed no significant changes.  There is a positive history of breast cancer in her maternal great aunts. Genetic testing has not been done.  There is no family history of ovarian cancer.  The patient does do monthly self breast exams.  The patient smokes 1/2 ppd.  The patient does not drink alcohol.  The patient does not use illegal drugs.  The patient has not been exercising. She wants to resume walking on the treadmill or using the stationary bike x 1hour 4x/week.  The patient does not get adequate calcium in her diet.  She had a recent cholesterol screen in 2018 that was normal on her statin.   The patient denies current symptoms of depression.    Review of Systems: Review of Systems  Constitutional: Negative for chills, fever and weight loss.       Positive for weight  gain  HENT: Negative for congestion, sinus pain and sore throat.   Eyes: Negative for blurred vision and pain.  Respiratory: Negative for hemoptysis, shortness of breath and wheezing.   Cardiovascular: Negative for chest pain, palpitations and leg swelling.  Gastrointestinal: Negative for abdominal pain, blood in stool, diarrhea, heartburn, nausea and vomiting.  Genitourinary: Negative for dysuria, frequency, hematuria and urgency.       Positive for amenorrhea  Musculoskeletal: Negative for back pain, joint pain and myalgias.  Skin: Negative for itching and rash.  Neurological: Negative for dizziness, tingling and headaches.  Endo/Heme/Allergies: Negative for environmental allergies and polydipsia. Does not bruise/bleed easily.       Negative for hirsutism   Psychiatric/Behavioral: Negative for depression. The patient is not nervous/anxious and does not have insomnia.     Past Medical History:  Past Medical History:  Diagnosis Date  . Chronic kidney disease   . Depression    mild, no meds  . Diabetes mellitus without complication (HCC)   . GERD (gastroesophageal reflux disease)   . Hyperlipidemia   . Hypertension   . Hypothyroidism (acquired)   . IBS (irritable bowel syndrome)   . Morbid obesity (HCC)   . Tobacco abuse     Past Surgical History:  Past Surgical History:  Procedure Laterality Date  . CHOLECYSTECTOMY  2005  . COLONOSCOPY  2011   Dr. Mechele Collin  . kidney stones removed  2012    Family History:  Family History  Problem Relation Age of Onset  . Diabetes Mother   . Cancer Mother [id number]       started in gallbladder and mets to liver and bone  . Diabetes Father   . Stroke Father [id number]  . Diabetes Sister   . Breast cancer Paternal Aunt 260       5 paternal great aunts all had breast cancer  . Lung cancer Maternal Grandfather 60  . Heart disease Paternal Grandfather     Social History:  Social History   Social History  . Marital status: Divorced    Spouse  name: N/A  . Number of children: 0  . Years of education: N/A   Occupational History  . Customer Service    Social History Main Topics  . Smoking status: Current Every Day Smoker    Packs/day: 0.50    Years: 12.00    Types: Cigarettes  . Smokeless tobacco: Never Used  . Alcohol use No  . Drug use: No  . Sexual activity: Yes    Birth control/ protection: Injection   Other Topics Concern  . Not on file   Social History Narrative  . No narrative on file    Allergies:  Allergies  Allergen Reactions  . Isometheptene-Dichloral-Apap Hives    MIDRIN    Medications: Prior to Admission medications   Medication Sig Start Date End Date Taking? Authorizing Provider  amLODipine (NORVASC) 10 MG tablet 10 mg.  07/05/14   [provider]  atorvastatin (LIPITOR) 20 MG tablet Take 20 mg by mouth daily.  05/31/14   [provider]  enalapril (VASOTEC) 10 MG tablet Take 10 mg by mouth daily.  07/05/14   [provider]  glimepiride (AMARYL) 2 MG tablet Take 2 mg by mouth 2 (two) times daily.  05/31/14   [provider]  Insulin Human (INSULIN PUMP) SOLN Inject into the skin.    [provider]  levothyroxine (SYNTHROID, LEVOTHROID) 112 MCG tablet Take 112 mcg by mouth daily before breakfast.  05/31/14   [provider]  medroxyPROGESTERone (DEPO-PROVERA) 150 MG/ML injection Inject 1 mL (150 mg total) into the muscle every 3 (three) months. 07/05/16   Farrel ConnersGutierrez, Twinkle Sockwell, CNM  metFORMIN (GLUCOPHAGE) 1000 MG tablet Take 1,000 mg by mouth 2 (two) times daily with a meal.  05/31/14   [provider]  Multiple Vitamins-Minerals (MULTIVITAL PO) Take 1 capsule by mouth daily.    [provider]  omeprazole (PRILOSEC) 40 MG capsule Take 40 mg by mouth daily.  07/05/14   [provider]  VICTOZA 18 MG/3ML SOPN  04/23/14   [provider]    Physical Exam Vitals:BP 130/70   Pulse 88   Ht 5\' 1"  (1.549 m)   Wt 259 lb  (117.5 kg)   BMI 48.94 kg/m   General: NAD HEENT: normocephalic, anicteric Neck: no thyroid enlargement, no palpable nodules, no cervical lymphadenopathy  Pulmonary: No increased work of breathing, CTAB Cardiovascular: RRR, with a Grade III/VI holosystolic murmur at all areas  Breast: Breast symmetrical, no tenderness, no palpable nodules or masses, no skin or nipple retraction present, no nipple discharge.  No axillary, infraclavicular or supraclavicular lymphadenopathy. Abdomen: Soft, non-tender, obese with large pannus, non-distended.  Umbilicus without lesions.  No hepatomegaly or masses palpable. No evidence of hernia. Two bruises noted from SQ injections of Victoza Genitourinary:  External: Normal external female genitalia.  Normal urethral meatus, normal Bartholin's and Skene's glands.  Multiple epidermal cysts on the labia  Vagina: Normal  vaginal mucosa, no evidence of prolapse.    Cervix: Grossly normal in appearance, no bleeding, non-tender, small, nulliparous  Uterus: MP, normal size, shape, and consistency, mobile, and non-tender  Adnexa: No adnexal masses, non-tender. Very difficult exam due to body habitus  Rectal: deferred  Lymphatic: no evidence of inguinal lymphadenopathy Extremities: no edema, erythema, or tenderness; multiple varicose veins seen from knee down bilaterally Neurologic: Grossly intact Psychiatric: mood appropriate, affect full     Assessment: 46 y.o. G0P0000 annual gyn exam Tobacco abuse Morbid obesity  Plan:    1) Breast cancer screening - recommend monthly self breast exam and annual screening mammograms. Mammogram was ordered today.  2) STI screening was offered and accepted.  3) Cervical cancer screening - Pap smear done  4) Contraception - Depo Provera q12weeks. Next injection due week of 20 July  5) Discussed smoking cessation. Recommended quit line. Discussed use of nicotine patch, Chantix, Wellbutrin. Not ready to stop yet, but  thinking about it.  6) RTO 1 year for annual and prn  Farrel Conners, CNM

## 2016-09-12 NOTE — Patient Instructions (Signed)
Steps to Quit Smoking Smoking tobacco can be harmful to your health and can affect almost every organ in your body. Smoking puts you, and those around you, at risk for developing many serious chronic diseases. Quitting smoking is difficult, but it is one of the best things that you can do for your health. It is never too late to quit. What are the benefits of quitting smoking? When you quit smoking, you lower your risk of developing serious diseases and conditions, such as:  Lung cancer or lung disease, such as COPD.  Heart disease.  Stroke.  Heart attack.  Infertility.  Osteoporosis and bone fractures.  Additionally, symptoms such as coughing, wheezing, and shortness of breath may get better when you quit. You may also find that you get sick less often because your body is stronger at fighting off colds and infections. If you are pregnant, quitting smoking can help to reduce your chances of having a baby of low birth weight. How do I get ready to quit? When you decide to quit smoking, create a plan to make sure that you are successful. Before you quit:  Pick a date to quit. Set a date within the next two weeks to give you time to prepare.  Write down the reasons why you are quitting. Keep this list in places where you will see it often, such as on your bathroom mirror or in your car or wallet.  Identify the people, places, things, and activities that make you want to smoke (triggers) and avoid them. Make sure to take these actions: ? Throw away all cigarettes at home, at work, and in your car. ? Throw away smoking accessories, such as ashtrays and lighters. ? Clean your car and make sure to empty the ashtray. ? Clean your home, including curtains and carpets.  Tell your family, friends, and coworkers that you are quitting. Support from your loved ones can make quitting easier.  Talk with your health care provider about your options for quitting smoking.  Find out what treatment  options are covered by your health insurance.  What strategies can I use to quit smoking? Talk with your healthcare provider about different strategies to quit smoking. Some strategies include:  Quitting smoking altogether instead of gradually lessening how much you smoke over a period of time. Research shows that quitting "cold turkey" is more successful than gradually quitting.  Attending in-person counseling to help you build problem-solving skills. You are more likely to have success in quitting if you attend several counseling sessions. Even short sessions of 10 minutes can be effective.  Finding resources and support systems that can help you to quit smoking and remain smoke-free after you quit. These resources are most helpful when you use them often. They can include: ? Online chats with a counselor. ? Telephone quitlines. ? Printed self-help materials. ? Support groups or group counseling. ? Text messaging programs. ? Mobile phone applications.  Taking medicines to help you quit smoking. (If you are pregnant or breastfeeding, talk with your health care provider first.) Some medicines contain nicotine and some do not. Both types of medicines help with cravings, but the medicines that include nicotine help to relieve withdrawal symptoms. Your health care provider may recommend: ? Nicotine patches, gum, or lozenges. ? Nicotine inhalers or sprays. ? Non-nicotine medicine that is taken by mouth.  Talk with your health care provider about combining strategies, such as taking medicines while you are also receiving in-person counseling. Using these two strategies together   makes you more likely to succeed in quitting than if you used either strategy on its own. If you are pregnant or breastfeeding, talk with your health care provider about finding counseling or other support strategies to quit smoking. Do not take medicine to help you quit smoking unless told to do so by your health care  provider. What things can I do to make it easier to quit? Quitting smoking might feel overwhelming at first, but there is a lot that you can do to make it easier. Take these important actions:  Reach out to your family and friends and ask that they support and encourage you during this time. Call telephone quitlines, reach out to support groups, or work with a counselor for support.  Ask people who smoke to avoid smoking around you.  Avoid places that trigger you to smoke, such as bars, parties, or smoke-break areas at work.  Spend time around people who do not smoke.  Lessen stress in your life, because stress can be a smoking trigger for some people. To lessen stress, try: ? Exercising regularly. ? Deep-breathing exercises. ? Yoga. ? Meditating. ? Performing a body scan. This involves closing your eyes, scanning your body from head to toe, and noticing which parts of your body are particularly tense. Purposefully relax the muscles in those areas.  Download or purchase mobile phone or tablet apps (applications) that can help you stick to your quit plan by providing reminders, tips, and encouragement. There are many free apps, such as QuitGuide from the CDC (Centers for Disease Control and Prevention). You can find other support for quitting smoking (smoking cessation) through smokefree.gov and other websites.  How will I feel when I quit smoking? Within the first 24 hours of quitting smoking, you may start to feel some withdrawal symptoms. These symptoms are usually most noticeable 2-3 days after quitting, but they usually do not last beyond 2-3 weeks. Changes or symptoms that you might experience include:  Mood swings.  Restlessness, anxiety, or irritation.  Difficulty concentrating.  Dizziness.  Strong cravings for sugary foods in addition to nicotine.  Mild weight gain.  Constipation.  Nausea.  Coughing or a sore throat.  Changes in how your medicines work in your  body.  A depressed mood.  Difficulty sleeping (insomnia).  After the first 2-3 weeks of quitting, you may start to notice more positive results, such as:  Improved sense of smell and taste.  Decreased coughing and sore throat.  Slower heart rate.  Lower blood pressure.  Clearer skin.  The ability to breathe more easily.  Fewer sick days.  Quitting smoking is very challenging for most people. Do not get discouraged if you are not successful the first time. Some people need to make many attempts to quit before they achieve long-term success. Do your best to stick to your quit plan, and talk with your health care provider if you have any questions or concerns. This information is not intended to replace advice given to you by your health care provider. Make sure you discuss any questions you have with your health care provider. Document Released: 03/06/2001 Document Revised: 11/08/2015 Document Reviewed: 07/27/2014 Elsevier Interactive Patient Education  2017 Elsevier Inc.  

## 2016-09-14 LAB — IGP,CTNGTV,APT HPV,RFX16/18,45
Chlamydia, Nuc. Acid Amp: NEGATIVE
GONOCOCCUS, NUC. ACID AMP: NEGATIVE
HPV APTIMA: NEGATIVE
PAP Smear Comment: 0
Trich vag by NAA: NEGATIVE

## 2016-09-17 ENCOUNTER — Encounter: Payer: Self-pay | Admitting: Family Medicine

## 2016-09-17 ENCOUNTER — Ambulatory Visit (INDEPENDENT_AMBULATORY_CARE_PROVIDER_SITE_OTHER): Payer: 59 | Admitting: Family Medicine

## 2016-09-17 ENCOUNTER — Telehealth: Payer: Self-pay

## 2016-09-17 ENCOUNTER — Encounter: Payer: Self-pay | Admitting: Certified Nurse Midwife

## 2016-09-17 VITALS — BP 120/82 | HR 90 | Temp 98.1°F | Resp 16 | Wt 262.0 lb

## 2016-09-17 DIAGNOSIS — L723 Sebaceous cyst: Secondary | ICD-10-CM | POA: Diagnosis not present

## 2016-09-17 MED ORDER — CEPHALEXIN 500 MG PO CAPS: 500.0000 mg | ORAL_CAPSULE | Freq: Two times a day (BID) | ORAL | 0 refills | Status: DC

## 2016-09-17 NOTE — Telephone Encounter (Signed)
Pt has a cyst on breast that is way bigger than it should be.  [phone].

## 2016-09-17 NOTE — Telephone Encounter (Signed)
Left msg for pt to call back for additional details. Recent visit did not indicate any findings upon breast exam.

## 2016-09-17 NOTE — Patient Instructions (Signed)
Discussed use of warm compresses 2-3 x day. Let us know if not improving or your seeing pus drainage.

## 2016-09-17 NOTE — Progress Notes (Signed)
Subjective:     Patient ID: Kendra Anderson, female   DOB: 01/18/1971, 46 y.o.   MRN: [id number]  HPI  Chief Complaint  Patient presents with  . Breast Discharge    Patient comes into office today with concerns of drainage to cyst on her left breast, patient states that she has had cyst on her breast for the past two years. Patient reports that last week during a Gyn visit for her yearly annual when breast exam was perfomed and cyst was squeezed on she noticed slight discomfort. Patient states when she got home she noticed site was red, tender and appeared to be larger in size, patiet reports thick white drainage from cyst.   She has mammogram pending. Apprehensive that this might represent breast cancer.   Review of Systems     Objective:   Physical Exam  Constitutional: She appears well-developed and well-nourished. No distress.  Skin:  Left medial breast with approx. 6 cm area of erythema. No drainage at this time with mild tenderness and underlying induration.  Psychiatric:  anxious       Assessment:    1. Inflamed epidermoid cyst of skin - cephALEXin (KEFLEX) 500 MG capsule; Take 1 capsule (500 mg total) by mouth 2 (two) times daily.  Dispense: 14 capsule; Refill: 0    Plan:   Discussed use of warm compresses. Return if not improving or purulent drainage.

## 2016-09-19 ENCOUNTER — Encounter: Payer: Self-pay | Admitting: Family Medicine

## 2016-09-19 ENCOUNTER — Ambulatory Visit (INDEPENDENT_AMBULATORY_CARE_PROVIDER_SITE_OTHER): Payer: 59 | Admitting: Family Medicine

## 2016-09-19 VITALS — BP 104/82 | HR 100 | Temp 98.3°F | Resp 17 | Wt 261.2 lb

## 2016-09-19 DIAGNOSIS — L723 Sebaceous cyst: Secondary | ICD-10-CM | POA: Diagnosis not present

## 2016-09-19 MED ORDER — HYDROCODONE-ACETAMINOPHEN 5-325 MG PO TABS: ORAL_TABLET | ORAL | 0 refills | Status: DC

## 2016-09-19 NOTE — Patient Instructions (Signed)
Apply dressing daily after cleaning with soap and water.Continue with the current antibiotic. We will call you with the results of the wound culture and change the antibiotic as needed.

## 2016-09-19 NOTE — Progress Notes (Signed)
Subjective:     Patient ID: Kendra Anderson, female   DOB: 09/25/1970, 46 y.o.   MRN: [id number]  HPI  Chief Complaint  Patient presents with  . Cyst    Patient returns back to office office today fo follow up for inflamed cyst on the left breast. Patient reports that she has ben applying warm compress to site but skin has become more red and grown in size. Patient states that skin feels hot to the touch and she is unable to wear a bra because of the friction.   Reports compliance with cephalexin.   Review of Systems     Objective:   Physical Exam  Constitutional: She appears well-developed and well-nourished. No distress.  Skin:  Left medial breast with increased area of induration and tenderness.Erythema extends towards central breast but no underlying induration. Procedure note: Cleansed with alcohol and infiltrated with lidocaine/epineprine. Incised with moderate return of purulent drainage but no thick cyst debris. C & S obtained       Assessment:    1. Inflamed epidermoid cyst of skin - INCISION AND DRAINAGE; Future - WOUND CULTURE - HYDROcodone-acetaminophen (NORCO/VICODIN) 5-325 MG tablet; One every 4-6 hours as needed for pain  Dispense: 6 tablet; Refill: 0    Plan:    Further f/u pending wound culture. Discussed cleansing with soap and water daily and apply dressing.Work excuse provided for 6/26-6/27/18.

## 2016-09-22 LAB — WOUND CULTURE: ORGANISM ID, BACTERIA: NONE SEEN

## 2016-09-24 ENCOUNTER — Telehealth: Payer: Self-pay

## 2016-09-24 NOTE — Telephone Encounter (Signed)
Patient states that her wound is healing well she no longer has swelling or hear at the site, patient states that wound is steal draining a clear/reddish fluid, she wants to know if this is normal?

## 2016-09-24 NOTE — Telephone Encounter (Signed)
-----   Message from Anola Gurneyobert Chauvin, [place] sent at 09/24/2016  7:35 AM EDT ----- No specific organism detected. Are you continuing to get better?

## 2016-09-24 NOTE — Telephone Encounter (Signed)
Yes, that drainage is ok as long as there is no pus.

## 2016-09-24 NOTE — Telephone Encounter (Signed)
LMTCB ED 

## 2016-09-24 NOTE — Telephone Encounter (Signed)
LMTCB-KW 

## 2016-09-25 ENCOUNTER — Encounter: Payer: Self-pay | Admitting: Certified Nurse Midwife

## 2016-09-27 ENCOUNTER — Other Ambulatory Visit: Payer: Self-pay | Admitting: Family Medicine

## 2016-09-27 DIAGNOSIS — L089 Local infection of the skin and subcutaneous tissue, unspecified: Secondary | ICD-10-CM

## 2016-09-27 DIAGNOSIS — L720 Epidermal cyst: Principal | ICD-10-CM

## 2016-09-27 MED ORDER — AMOXICILLIN-POT CLAVULANATE 875-125 MG PO TABS: 1.0000 | ORAL_TABLET | Freq: Two times a day (BID) | ORAL | 0 refills | Status: DC

## 2016-09-27 NOTE — Telephone Encounter (Signed)
Spoke with patient and she states it is like a pus drainage now, yellow color. No pain, no redness, no fever or chills-aa

## 2016-09-27 NOTE — Telephone Encounter (Signed)
I have sent in Augmentin for a week which she has been on before. If drainage not improving on this antibiotic would want her to f/u with Dr. Sherrie MustacheFisher.

## 2016-09-27 NOTE — Telephone Encounter (Signed)
Pt advised and [facility] [address], cancelled [facility]

## 2016-10-03 ENCOUNTER — Ambulatory Visit (INDEPENDENT_AMBULATORY_CARE_PROVIDER_SITE_OTHER): Payer: 59

## 2016-10-03 DIAGNOSIS — Z308 Encounter for other contraceptive management: Secondary | ICD-10-CM | POA: Diagnosis not present

## 2016-10-03 MED ORDER — MEDROXYPROGESTERONE ACETATE 150 MG/ML IM SUSP
150.0000 mg | Freq: Once | INTRAMUSCULAR | Status: AC
  Administered 2016-10-03: 150 mg via INTRAMUSCULAR

## 2016-10-03 NOTE — Progress Notes (Signed)
Pt here for depo which was given IM right glut.  NDC# [address]-2 

## 2016-10-24 ENCOUNTER — Encounter: Payer: Self-pay | Admitting: Obstetrics and Gynecology

## 2016-10-24 DIAGNOSIS — Z803 Family history of malignant neoplasm of breast: Secondary | ICD-10-CM

## 2016-10-24 HISTORY — DX: Family history of malignant neoplasm of breast: Z80.3

## 2016-12-26 ENCOUNTER — Ambulatory Visit (INDEPENDENT_AMBULATORY_CARE_PROVIDER_SITE_OTHER): Payer: 59

## 2016-12-26 DIAGNOSIS — Z3042 Encounter for surveillance of injectable contraceptive: Secondary | ICD-10-CM

## 2016-12-26 MED ORDER — MEDROXYPROGESTERONE ACETATE 150 MG/ML IM SUSP
150.0000 mg | Freq: Once | INTRAMUSCULAR | Status: AC
  Administered 2016-12-26: 150 mg via INTRAMUSCULAR

## 2017-01-07 NOTE — Telephone Encounter (Signed)
Saw PCP

## 2017-02-18 ENCOUNTER — Encounter: Payer: Self-pay | Admitting: Family Medicine

## 2017-02-18 ENCOUNTER — Ambulatory Visit (INDEPENDENT_AMBULATORY_CARE_PROVIDER_SITE_OTHER): Payer: 59 | Admitting: Family Medicine

## 2017-02-18 VITALS — BP 126/74 | HR 88 | Temp 97.9°F | Resp 16 | Wt 267.0 lb

## 2017-02-18 DIAGNOSIS — L02234 Carbuncle of groin: Secondary | ICD-10-CM

## 2017-02-18 MED ORDER — CEPHALEXIN 500 MG PO CAPS: 500.0000 mg | ORAL_CAPSULE | Freq: Three times a day (TID) | ORAL | 0 refills | Status: DC

## 2017-02-18 NOTE — Progress Notes (Signed)
Subjective:     Patient ID: Kendra Anderson, female   DOB: 01/15/1971, 46 y.o.   MRN: [id number] Chief Complaint  Patient presents with  . Recurrent Skin Infections    First noticed it 02/15/2017.  Pt states it is in her vaginal area and gradually worsening.   No hx of MRSA. Last culture of infected cyst without specific organism detected. Has been applying warm compresses. HPI   Review of Systems     Objective:   Physical Exam  Constitutional: She appears well-developed and well-nourished. No distress.  Skin:  Right groin with early carbuncle, no drainage or pointing. Has underlying induration with tenderness on palpation.       Assessment:    1. Carbuncle of groin: cephalexin 500 mg. 3 x day #21.    Plan:    Continue warm compresses. Return in 2 days if not improving for  I & D.

## 2017-02-18 NOTE — Patient Instructions (Signed)
Continue warm compresses and start antibiotic 3 x day. If the pain and redness go away you can cancel the appointment.

## 2017-02-20 ENCOUNTER — Ambulatory Visit: Payer: 59 | Admitting: Family Medicine

## 2017-03-20 ENCOUNTER — Ambulatory Visit (INDEPENDENT_AMBULATORY_CARE_PROVIDER_SITE_OTHER): Payer: 59

## 2017-03-20 DIAGNOSIS — Z308 Encounter for other contraceptive management: Secondary | ICD-10-CM | POA: Diagnosis not present

## 2017-03-20 MED ORDER — MEDROXYPROGESTERONE ACETATE 150 MG/ML IM SUSP
150.0000 mg | Freq: Once | INTRAMUSCULAR | Status: AC
  Administered 2017-03-20: 150 mg via INTRAMUSCULAR

## 2017-03-20 NOTE — Progress Notes (Signed)
Pt here for depo which was given IM right glut.  NDC# [address]-2 

## 2017-06-11 ENCOUNTER — Telehealth: Payer: Self-pay | Admitting: Certified Nurse Midwife

## 2017-06-11 ENCOUNTER — Ambulatory Visit (INDEPENDENT_AMBULATORY_CARE_PROVIDER_SITE_OTHER): Payer: Managed Care, Other (non HMO)

## 2017-06-11 DIAGNOSIS — Z3042 Encounter for surveillance of injectable contraceptive: Secondary | ICD-10-CM

## 2017-06-11 MED ORDER — MEDROXYPROGESTERONE ACETATE 150 MG/ML IM SUSP
150.0000 mg | Freq: Once | INTRAMUSCULAR | Status: AC
  Administered 2017-06-11: 150 mg via INTRAMUSCULAR

## 2017-06-11 NOTE — Telephone Encounter (Signed)
Patient needs refill on depo before her next injection scheduled 6/11.  Annual is scheduled 6/21 with [facility].  She would like to [facility] [address]

## 2017-06-12 ENCOUNTER — Ambulatory Visit: Payer: 59

## 2017-06-17 MED ORDER — MEDROXYPROGESTERONE ACETATE 150 MG/ML IM SUSP: 150.0000 mg | INTRAMUSCULAR | 0 refills | Status: DC

## 2017-07-08 ENCOUNTER — Ambulatory Visit: Payer: Managed Care, Other (non HMO) | Admitting: Anesthesiology

## 2017-07-08 ENCOUNTER — Encounter: Payer: Self-pay | Admitting: Anesthesiology

## 2017-07-08 ENCOUNTER — Encounter
Admission: RE | Disposition: A | Discharge status: Home / Self Care | Payer: Self-pay | Source: Ambulatory Visit | Attending: Unknown Physician Specialty

## 2017-07-08 ENCOUNTER — Ambulatory Visit
Admission: RE | Admit: 2017-07-08 | Discharge: 2017-07-08 | Disposition: A | Discharge status: Home / Self Care | Payer: Managed Care, Other (non HMO) | Source: Ambulatory Visit | Attending: Unknown Physician Specialty | Admitting: Unknown Physician Specialty

## 2017-07-08 DIAGNOSIS — K640 First degree hemorrhoids: Secondary | ICD-10-CM | POA: Insufficient documentation

## 2017-07-08 DIAGNOSIS — E785 Hyperlipidemia, unspecified: Secondary | ICD-10-CM | POA: Insufficient documentation

## 2017-07-08 DIAGNOSIS — E1122 Type 2 diabetes mellitus with diabetic chronic kidney disease: Secondary | ICD-10-CM | POA: Insufficient documentation

## 2017-07-08 DIAGNOSIS — I129 Hypertensive chronic kidney disease with stage 1 through stage 4 chronic kidney disease, or unspecified chronic kidney disease: Secondary | ICD-10-CM | POA: Insufficient documentation

## 2017-07-08 DIAGNOSIS — Z794 Long term (current) use of insulin: Secondary | ICD-10-CM | POA: Diagnosis not present

## 2017-07-08 DIAGNOSIS — Z79890 Hormone replacement therapy (postmenopausal): Secondary | ICD-10-CM | POA: Diagnosis not present

## 2017-07-08 DIAGNOSIS — F17210 Nicotine dependence, cigarettes, uncomplicated: Secondary | ICD-10-CM | POA: Diagnosis not present

## 2017-07-08 DIAGNOSIS — N189 Chronic kidney disease, unspecified: Secondary | ICD-10-CM | POA: Diagnosis not present

## 2017-07-08 DIAGNOSIS — K529 Noninfective gastroenteritis and colitis, unspecified: Secondary | ICD-10-CM | POA: Insufficient documentation

## 2017-07-08 DIAGNOSIS — Z6841 Body Mass Index (BMI) 40.0 and over, adult: Secondary | ICD-10-CM | POA: Insufficient documentation

## 2017-07-08 DIAGNOSIS — Z79899 Other long term (current) drug therapy: Secondary | ICD-10-CM | POA: Diagnosis not present

## 2017-07-08 DIAGNOSIS — K635 Polyp of colon: Secondary | ICD-10-CM | POA: Insufficient documentation

## 2017-07-08 DIAGNOSIS — E039 Hypothyroidism, unspecified: Secondary | ICD-10-CM | POA: Insufficient documentation

## 2017-07-08 DIAGNOSIS — K219 Gastro-esophageal reflux disease without esophagitis: Secondary | ICD-10-CM | POA: Insufficient documentation

## 2017-07-08 HISTORY — PX: COLONOSCOPY WITH PROPOFOL: SHX5780

## 2017-07-08 LAB — GLUCOSE, CAPILLARY: GLUCOSE-CAPILLARY: 167 mg/dL — AB (ref 65–99)

## 2017-07-08 LAB — POCT PREGNANCY, URINE: Preg Test, Ur: NEGATIVE

## 2017-07-08 SURGERY — COLONOSCOPY WITH PROPOFOL
Anesthesia: General

## 2017-07-08 MED ORDER — PHENYLEPHRINE HCL 10 MG/ML IJ SOLN
INTRAMUSCULAR | Status: AC
  Filled 2017-07-08: qty 1

## 2017-07-08 MED ORDER — LIDOCAINE HCL (PF) 1 % IJ SOLN: 2.0000 mL | Freq: Once | INTRAMUSCULAR | Status: DC

## 2017-07-08 MED ORDER — SODIUM CHLORIDE 0.9 % IV SOLN: INTRAVENOUS | Status: DC

## 2017-07-08 MED ORDER — PROPOFOL 10 MG/ML IV BOLUS
INTRAVENOUS | Status: DC | PRN
  Administered 2017-07-08: 60 mg via INTRAVENOUS
  Administered 2017-07-08: 40 mg via INTRAVENOUS

## 2017-07-08 MED ORDER — LIDOCAINE HCL (PF) 2 % IJ SOLN
INTRAMUSCULAR | Status: AC
Start: 2017-07-08 — End: 2017-07-08
  Filled 2017-07-08: qty 10

## 2017-07-08 MED ORDER — SODIUM CHLORIDE 0.9 % IV SOLN
INTRAVENOUS | Status: DC
  Administered 2017-07-08: 1000 mL via INTRAVENOUS

## 2017-07-08 MED ORDER — PROPOFOL 500 MG/50ML IV EMUL
INTRAVENOUS | Status: DC | PRN
  Administered 2017-07-08: 125 ug/kg/min via INTRAVENOUS

## 2017-07-08 MED ORDER — PROPOFOL 500 MG/50ML IV EMUL
INTRAVENOUS | Status: AC
  Filled 2017-07-08: qty 50

## 2017-07-08 MED ORDER — LIDOCAINE HCL (CARDIAC) 20 MG/ML IV SOLN
INTRAVENOUS | Status: DC | PRN
  Administered 2017-07-08: 50 mg via INTRAVENOUS

## 2017-07-08 NOTE — Anesthesia Post-op Follow-up Note (Signed)
Anesthesia QCDR form completed.        

## 2017-07-08 NOTE — Anesthesia Procedure Notes (Signed)
Performed by: Paarth Cropper, CRNA Pre-anesthesia Checklist: Patient identified, Emergency Drugs available, Suction available, Patient being monitored and Timeout performed Patient Re-evaluated:Patient Re-evaluated prior to induction Oxygen Delivery Method: Nasal cannula Induction Type: IV induction       

## 2017-07-08 NOTE — Anesthesia Preprocedure Evaluation (Signed)
Anesthesia Evaluation  Patient identified by MRN, date of birth, ID band Patient awake    Reviewed: Allergy & Precautions, NPO status , Patient's Chart, lab work & pertinent test results, reviewed documented beta blocker date and time   Airway Mallampati: III  TM Distance: >3 FB     Dental  (+) Chipped   Pulmonary Current Smoker,           Cardiovascular hypertension,      Neuro/Psych PSYCHIATRIC DISORDERS Depression    GI/Hepatic GERD  ,  Endo/Other  diabetes, Type 2Hypothyroidism Morbid obesity  Renal/GU Renal disease     Musculoskeletal   Abdominal   Peds  Hematology   Anesthesia Other Findings   Reproductive/Obstetrics                             Anesthesia Physical Anesthesia Plan  ASA: III  Anesthesia Plan: General   Post-op Pain Management:    Induction: Intravenous  PONV Risk Score and Plan:   Airway Management Planned:   Additional Equipment:   Intra-op Plan:   Post-operative Plan:   Informed Consent: I have reviewed the patients History and Physical, chart, labs and discussed the procedure including the risks, benefits and alternatives for the proposed anesthesia with the patient or authorized representative who has indicated his/her understanding and acceptance.     Plan Discussed with: CRNA  Anesthesia Plan Comments:         Anesthesia Quick Evaluation

## 2017-07-08 NOTE — Anesthesia Postprocedure Evaluation (Signed)
Anesthesia Post Note  Patient: Kendra Anderson  Procedure(s) Performed: COLONOSCOPY WITH PROPOFOL (N/A )  Patient location during evaluation: Endoscopy Anesthesia Type: General Level of consciousness: awake and alert Pain management: pain level controlled Vital Signs Assessment: post-procedure vital signs reviewed and stable Respiratory status: spontaneous breathing, nonlabored ventilation, respiratory function stable and patient connected to nasal cannula oxygen Cardiovascular status: blood pressure returned to baseline and stable Postop Assessment: no apparent nausea or vomiting Anesthetic complications: no     Last Vitals:  Vitals:   07/08/17 0802 07/08/17 0808  BP: (!) 92/55 113/70  Pulse: 81   Resp: (!) 27   Temp: (!) 36.1 C   SpO2: 99%     Last Pain:  Vitals:   07/08/17 0802  TempSrc: Tympanic  PainSc: 0-No pain                 Vicke Plotner S

## 2017-07-08 NOTE — Op Note (Signed)
[facility] Gastroenterology Patient Name: Kendra CholKimberly Carapia Procedure Date: 07/08/2017 7:25 AM MRN: [id number] Account #: [id number] Date of Birth: 07/27/1970 Admit Type: Outpatient Age: [age] [facility] END[facility]OM 1 Gender: Female Note Status: Finalized Procedure:            Colonoscopy Indications:          Chronic diarrhea, Clinically significant diarrhea of                        unexplained origin Providers:            Scot Junobert T. Nahia Nissan, MD Referring MD:         Demetrios Isaacsonald E. Sherrie MustacheFisher, MD (Referring MD) Medicines:            Propofol per Anesthesia Complications:        No immediate complications. Procedure:            Pre-Anesthesia Assessment:                       - After reviewing the risks and benefits, the patient                        was deemed in satisfactory condition to undergo the                        procedure.                       After obtaining informed consent, the colonoscope was                        passed under direct vision. Throughout the procedure,                        the patient's blood pressure, pulse, and oxygen                        saturations were monitored continuously. The                        Colonoscope was introduced through the anus and                        advanced to the the cecum, identified by appendiceal                        orifice and ileocecal valve. The colonoscopy was                        performed without difficulty. The patient tolerated the                        procedure well. The quality of the bowel preparation                        was excellent. Findings:      A diminutive polyp was found in the distal sigmoid colon. The polyp was       sessile. The polyp was removed with a jumbo cold forceps. Resection and       retrieval were complete.      Internal hemorrhoids were found during  endoscopy. The hemorrhoids were       small and Grade I (internal hemorrhoids that do not prolapse).      The lining  of the colon looked normal. Biopsies done of ascending ,       transverse, descending and sigmoid colon for microscopic evaluation       because of chronic diarrhea. Impression:           - One diminutive polyp in the distal sigmoid colon,                        removed with a jumbo cold forceps. Resected and                        retrieved.                       - Internal hemorrhoids. Recommendation:       - Await pathology results. Scot Jun, MD 07/08/2017 8:01:18 AM This report has been signed electronically. Number of Addenda: 0 Note Initiated On: 07/08/2017 7:25 AM Scope Withdrawal Time: 0 hours 9 minutes 7 seconds  Total Procedure Duration: 0 hours 13 minutes 5 seconds       [facility]

## 2017-07-08 NOTE — H&P (Signed)
Primary Care Physician:  Birdie Sons, MD Primary Gastroenterologist:  Dr. Vira Agar  Pre-Procedure History & Physical: HPI:  Kendra Anderson is a 47 y.o. female is here for an colonoscopy.  This is for chronic diarrhea.   Past Medical History:  Diagnosis Date  . Chronic kidney disease   . Depression    mild, no meds  . Diabetes mellitus without complication ([place])   . Family history of breast cancer 10/2016   BRCA genetic testing letter sent  . GERD (gastroesophageal reflux disease)   . Hyperlipidemia   . Hypertension   . Hypothyroidism (acquired)   . IBS (irritable bowel syndrome)   . Morbid obesity ([place])   . Tobacco abuse     Past Surgical History:  Procedure Laterality Date  . CHOLECYSTECTOMY  2005  . COLONOSCOPY  2011   Dr. Vira Agar  . kidney stones removed  2012    Prior to Admission medications   Medication Sig Start Date End Date Taking? Authorizing Provider  omeprazole (PRILOSEC) 40 MG capsule Take 40 mg by mouth daily.   Yes [provider]  amLODipine (NORVASC) 10 MG tablet 10 mg.  07/05/14   [provider]  amoxicillin-clavulanate (AUGMENTIN) 875-125 MG tablet Take 1 tablet by mouth 2 (two) times daily. Patient not taking: Reported on 07/05/2017 09/27/16   Carmon Ginsberg, PA  atorvastatin (LIPITOR) 20 MG tablet Take 20 mg by mouth daily.  05/31/14   [provider]  cephALEXin (KEFLEX) 500 MG capsule Take 1 capsule (500 mg total) by mouth 3 (three) times daily. Patient not taking: Reported on 07/05/2017 02/18/17   Carmon Ginsberg, PA  enalapril (VASOTEC) 10 MG tablet Take 10 mg by mouth daily.  07/05/14   [provider]  glucose blood (ONE TOUCH ULTRA TEST) test strip USE 4 TIMES DAILY 01/20/16   [provider]  HUMALOG 100 UNIT/ML injection  08/16/16   [provider]  HYDROcodone-acetaminophen (NORCO/VICODIN) 5-325 MG tablet One every 4-6 hours as needed for pain 09/19/16   Carmon Ginsberg, PA   HYDROcodone-homatropine (HYCODAN) 5-1.5 MG/5ML syrup Take by mouth. 08/29/16   [provider]  Insulin Human (INSULIN PUMP) SOLN Inject into the skin.    [provider]  Insulin Pen Needle (FIFTY50 PEN NEEDLES) 31G X 8 MM MISC USE ONCE DAILY WITH [place] INJECTION 02/28/15   [provider]  LANCETS MICRO THIN 33G MISC Use 1 each 5 (five) times daily. 02/28/15   [provider]  levothyroxine (SYNTHROID, LEVOTHROID) 112 MCG tablet Take 112 mcg by mouth daily before breakfast.  05/31/14   [provider]  medroxyPROGESTERone (DEPO-PROVERA) 150 MG/ML injection Inject 1 mL (150 mg total) into the muscle every 3 (three) months. 06/17/17   Dalia Heading, CNM  metFORMIN (GLUCOPHAGE) 1000 MG tablet Take 1,000 mg by mouth 2 (two) times daily with a meal.  05/31/14   [provider]  Multiple Vitamins-Minerals (MULTIVITAL PO) Take 1 capsule by mouth daily.    [provider]  VICTOZA 18 MG/3ML SOPN  04/23/14   [provider]    Allergies as of 04/29/2017 - Review Complete 02/18/2017  Allergen Reaction Noted  . Isometheptene-dichloral-apap Hives 12/05/2015    Family History  Problem Relation Age of Onset  . Diabetes Mother   . Cancer Mother 88       started in gallbladder and mets to liver and bone  . Diabetes Father   . Stroke Father 52  . Diabetes Sister   .  Breast cancer Paternal Aunt 70       5 paternal great aunts all had breast cancer  . Lung cancer Maternal Grandfather 60  . Heart disease Paternal Grandfather     Social History   Socioeconomic History  . Marital status: Divorced    Spouse name: Not on file  . Number of children: 0  . Years of education: Not on file  . Highest education level: Not on file  Occupational History  . Occupation: Therapist, art  Social Needs  . Financial resource strain: Not on file  . Food insecurity:    Worry: Not on file    Inability: Not on file  . Transportation needs:     Medical: Not on file    Non-medical: Not on file  Tobacco Use  . Smoking status: Current Every Day Smoker    Packs/day: 0.50    Years: 12.00    Pack years: 6.00    Types: Cigarettes  . Smokeless tobacco: Never Used  Substance and Sexual Activity  . Alcohol use: No    Alcohol/week: 0.0 oz  . Drug use: No  . Sexual activity: Yes    Birth control/protection: Injection  Lifestyle  . Physical activity:    Days per week: Not on file    Minutes per session: Not on file  . Stress: Not on file  Relationships  . Social connections:    Talks on phone: Not on file    Gets together: Not on file    Attends religious service: Not on file    Active member of club or organization: Not on file    Attends meetings of clubs or organizations: Not on file    Relationship status: Not on file  . Intimate partner violence:    Fear of current or ex partner: Not on file    Emotionally abused: Not on file    Physically abused: Not on file    Forced sexual activity: Not on file  Other Topics Concern  . Not on file  Social History Narrative  . Not on file    Review of Systems: See HPI, otherwise negative ROS  Physical Exam: BP 127/76   Pulse 80   Temp 98.8 F (37.1 C)   Resp 16   Ht '5\' 1"'  (1.549 m)   Wt 117.5 kg (259 lb)   SpO2 99%   BMI 48.94 kg/m  General:   Alert,  pleasant and cooperative in NAD Head:  Normocephalic and atraumatic. Neck:  Supple; no masses or thyromegaly. Lungs:  Clear throughout to auscultation.    Heart:  Regular rate and rhythm. Abdomen:  Soft, nontender and nondistended. Normal bowel sounds, without guarding, and without rebound.   Neurologic:  Alert and  oriented x4;  grossly normal neurologically.  Impression/Plan: Kendra Anderson is here for an colonoscopy to be performed for chronic diarrhea.  Risks, benefits, limitations, and alternatives regarding  colonoscopy have been reviewed with the patient.  Questions have been answered.  All parties  agreeable.   Gaylyn Cheers, MD  07/08/2017, 7:33 AM

## 2017-07-08 NOTE — Transfer of Care (Signed)
Immediate Anesthesia Transfer of Care Note  Patient: Kendra Anderson  Procedure(s) Performed: COLONOSCOPY WITH PROPOFOL (N/A )  Patient Location: PACU  Anesthesia Type:General  Level of Consciousness: sedated and responds to stimulation  Airway & Oxygen Therapy: Patient Spontanous Breathing and Patient connected to nasal cannula oxygen  Post-op Assessment: Report given to RN and Post -op Vital signs reviewed and stable  Post vital signs: Reviewed and stable  Last Vitals:  Vitals Value Taken Time  BP 92/55 07/08/2017  8:02 AM  Temp    Pulse 81 07/08/2017  8:02 AM  Resp 23 07/08/2017  8:02 AM  SpO2 100 % 07/08/2017  8:02 AM  Vitals shown include unvalidated device data.  Last Pain:  Vitals:   07/08/17 0705  PainSc: 8          Complications: No apparent anesthesia complications

## 2017-07-10 ENCOUNTER — Encounter: Payer: Self-pay | Admitting: Unknown Physician Specialty

## 2017-07-10 LAB — SURGICAL PATHOLOGY

## 2017-08-29 ENCOUNTER — Telehealth: Payer: Self-pay | Admitting: Certified Nurse Midwife

## 2017-08-29 NOTE — Telephone Encounter (Signed)
Patient is schedule 09/03/17 for Depo needs refill. Schedule for Annual 09/13/17. [facility] [address] [place].

## 2017-08-30 ENCOUNTER — Other Ambulatory Visit: Payer: Self-pay

## 2017-08-30 DIAGNOSIS — Z3042 Encounter for surveillance of injectable contraceptive: Secondary | ICD-10-CM

## 2017-08-30 MED ORDER — MEDROXYPROGESTERONE ACETATE 150 MG/ML IM SUSP: 150.0000 mg | INTRAMUSCULAR | 0 refills | Status: DC

## 2017-08-30 NOTE — Telephone Encounter (Signed)
Pt calling to be sure [facility]  A[facility] is scheduled.  Pt aware refill eRx'd.

## 2017-08-30 NOTE — Telephone Encounter (Signed)
rx sent by [facility]J.

## 2017-09-03 ENCOUNTER — Ambulatory Visit (INDEPENDENT_AMBULATORY_CARE_PROVIDER_SITE_OTHER): Payer: Managed Care, Other (non HMO)

## 2017-09-03 DIAGNOSIS — Z3042 Encounter for surveillance of injectable contraceptive: Secondary | ICD-10-CM | POA: Diagnosis not present

## 2017-09-03 MED ORDER — MEDROXYPROGESTERONE ACETATE 150 MG/ML IM SUSP
150.0000 mg | Freq: Once | INTRAMUSCULAR | Status: AC
Start: 2017-09-03 — End: 2017-09-03
  Administered 2017-09-03: 150 mg via INTRAMUSCULAR

## 2017-09-09 ENCOUNTER — Ambulatory Visit: Payer: Managed Care, Other (non HMO) | Admitting: Family Medicine

## 2017-09-09 ENCOUNTER — Encounter: Payer: Self-pay | Admitting: Family Medicine

## 2017-09-09 VITALS — BP 112/70 | HR 88 | Temp 98.0°F | Resp 20 | Wt 263.0 lb

## 2017-09-09 DIAGNOSIS — S838X2A Sprain of other specified parts of left knee, initial encounter: Secondary | ICD-10-CM

## 2017-09-09 DIAGNOSIS — M25562 Pain in left knee: Secondary | ICD-10-CM | POA: Diagnosis not present

## 2017-09-09 MED ORDER — PREDNISONE 20 MG PO TABS: 20.0000 mg | ORAL_TABLET | Freq: Two times a day (BID) | ORAL | 0 refills | Status: DC

## 2017-09-09 NOTE — Patient Instructions (Signed)
   You need to wear an OTC elastic knee brace all day. You can remove it at night

## 2017-09-09 NOTE — Progress Notes (Signed)
Patient: Kendra Anderson Female    DOB: 1971-01-31   47 y.o.   MRN: [id number] Visit Date: 09/09/2017  Today's Provider: Mila Merry, MD   Chief Complaint  Patient presents with  . Knee Pain    x 2-3 weeks   Subjective:    Knee Pain   Incident onset: 2-3 weeks ago. There was no injury mechanism. The pain is present in the right knee. Quality: throbbing  The pain is severe. The pain has been constant since onset. Associated symptoms include an inability to bear weight and muscle weakness. Pertinent negatives include no numbness or tingling. Treatments tried: Topical Analgesic. The treatment provided no relief.       Allergies  Allergen Reactions  . Isometheptene-Dichloral-Apap Hives    MIDRIN     Current Outpatient Medications:  .  amLODipine (NORVASC) 10 MG tablet, 10 mg. , Disp: , Rfl:  .  atorvastatin (LIPITOR) 20 MG tablet, Take 20 mg by mouth daily. , Disp: , Rfl:  .  enalapril (VASOTEC) 10 MG tablet, Take 10 mg by mouth daily. , Disp: , Rfl:  .  glucose blood (ONE TOUCH ULTRA TEST) test strip, USE 4 TIMES DAILY, Disp: , Rfl:  .  HUMALOG 100 UNIT/ML injection, , Disp: , Rfl:  .  Insulin Human (INSULIN PUMP) SOLN, Inject into the skin., Disp: , Rfl:  .  Insulin Pen Needle (FIFTY50 PEN NEEDLES) 31G X 8 MM MISC, USE ONCE DAILY WITH VICTOZA INJECTION, Disp: , Rfl:  .  LANCETS MICRO THIN 33G MISC, Use 1 each 5 (five) times daily., Disp: , Rfl:  .  levothyroxine (SYNTHROID, LEVOTHROID) 112 MCG tablet, Take 112 mcg by mouth daily before breakfast. , Disp: , Rfl:  .  medroxyPROGESTERone (DEPO-PROVERA) 150 MG/ML injection, Inject 1 mL (150 mg total) into the muscle every 3 (three) months., Disp: 1 mL, Rfl: 0 .  metFORMIN (GLUCOPHAGE) 1000 MG tablet, Take 1,000 mg by mouth 2 (two) times daily with a meal. , Disp: , Rfl:  .  Multiple Vitamins-Minerals (MULTIVITAL PO), Take 1 capsule by mouth daily., Disp: , Rfl:  .  VICTOZA 18 MG/3ML SOPN, , Disp: , Rfl:   Review of  Systems  Constitutional: Negative for appetite change, chills, fatigue and fever.  Respiratory: Negative for chest tightness and shortness of breath.   Cardiovascular: Negative for chest pain and palpitations.  Gastrointestinal: Negative for abdominal pain, nausea and vomiting.  Musculoskeletal: Positive for arthralgias (right knee) and joint swelling (right knee).  Neurological: Positive for weakness (right knee). Negative for dizziness, tingling and numbness.    Social History   Tobacco Use  . Smoking status: Current Every Day Smoker    Packs/day: 0.50    Years: 12.00    Pack years: 6.00    Types: Cigarettes  . Smokeless tobacco: Never Used  Substance Use Topics  . Alcohol use: No    Alcohol/week: 0.0 oz   Objective:   BP 112/70 (BP Location: Left Arm, Patient Position: Sitting, Cuff Size: Large)   Pulse 88   Temp 98 F (36.7 C) (Oral)   Resp 20   Wt 263 lb (119.3 kg)   SpO2 99% Comment: room air  BMI 49.69 kg/m  Vitals:   09/09/17 0945  BP: 112/70  Pulse: 88  Resp: 20  Temp: 98 F (36.7 C)  TempSrc: Oral  SpO2: 99%  Weight: 263 lb (119.3 kg)     Physical Exam  General appearance: alert, well developed,  well nourished, cooperative and in no distress Head: Normocephalic, without obvious abnormality, atraumatic Respiratory: Respirations even and unlabored, normal respiratory rate Extremities: Moderate swelling and tenderness localized medially to lower patellar tendon and over MCL. Exam limited by obesity.      Assessment & Plan:     1. Acute pain of left knee Suspect MCL strain versus medial meniscus injury. She cannot take NSAIDs due to CKD. Will get on short course of prednisone as below and get in with orthopedics this week.  - Ambulatory referral to Orthopedic Surgery  2. Sprain of other ligament of left knee, initial encounter  - predniSONE (DELTASONE) 20 MG tablet; Take 1 tablet (20 mg total) by mouth 2 (two) times daily with a meal.  Dispense: 10  tablet; Refill: 0       Mila Merryonald Raffael Bugarin, MD  [facility]

## 2017-09-10 ENCOUNTER — Ambulatory Visit: Payer: 59 | Admitting: Family Medicine

## 2017-09-11 ENCOUNTER — Encounter: Payer: Self-pay | Admitting: *Deleted

## 2017-09-11 ENCOUNTER — Telehealth: Payer: Self-pay | Admitting: Family Medicine

## 2017-09-11 MED ORDER — TRAMADOL HCL 50 MG PO TABS: 50.0000 mg | ORAL_TABLET | Freq: Three times a day (TID) | ORAL | 0 refills | Status: AC | PRN

## 2017-09-11 NOTE — Telephone Encounter (Signed)
Pt is requesting something to help with her knee pain until she can get to see an orthopedic. Pt stated that Tramadol or a muscle relaxer. [facility] [address]. Please advise. Thanks TNP

## 2017-09-11 NOTE — Telephone Encounter (Signed)
Pease advise? Patient seen on 09/09/17 for left knee pain.

## 2017-09-13 ENCOUNTER — Ambulatory Visit (INDEPENDENT_AMBULATORY_CARE_PROVIDER_SITE_OTHER): Payer: Managed Care, Other (non HMO) | Admitting: Certified Nurse Midwife

## 2017-09-13 ENCOUNTER — Encounter: Payer: Self-pay | Admitting: Certified Nurse Midwife

## 2017-09-13 VITALS — BP 132/78 | HR 84 | Ht 61.0 in | Wt 262.0 lb

## 2017-09-13 DIAGNOSIS — Z1231 Encounter for screening mammogram for malignant neoplasm of breast: Secondary | ICD-10-CM | POA: Diagnosis not present

## 2017-09-13 DIAGNOSIS — Z124 Encounter for screening for malignant neoplasm of cervix: Secondary | ICD-10-CM | POA: Diagnosis not present

## 2017-09-13 DIAGNOSIS — F17200 Nicotine dependence, unspecified, uncomplicated: Secondary | ICD-10-CM

## 2017-09-13 DIAGNOSIS — Z720 Tobacco use: Secondary | ICD-10-CM

## 2017-09-13 DIAGNOSIS — Z1239 Encounter for other screening for malignant neoplasm of breast: Secondary | ICD-10-CM

## 2017-09-13 DIAGNOSIS — Z01419 Encounter for gynecological examination (general) (routine) without abnormal findings: Secondary | ICD-10-CM

## 2017-09-13 DIAGNOSIS — Z716 Tobacco abuse counseling: Secondary | ICD-10-CM | POA: Diagnosis not present

## 2017-09-13 NOTE — Patient Instructions (Signed)
Bupropion sustained-release tablets (smoking cessation) What is this medicine? BUPROPION (byoo PROE pee on) is used to help people quit smoking. This medicine may be used for other purposes; ask your health care provider or pharmacist if you have questions. COMMON BRAND NAME(S): Buproban, Zyban What should I tell my health care provider before I take this medicine? They need to know if you have any of these conditions: -an eating disorder, such as anorexia or bulimia -bipolar disorder or psychosis -diabetes or high blood sugar, treated with medication -glaucoma -head injury or brain tumor -heart disease, previous heart attack, or irregular heart beat -high blood pressure -kidney or liver disease -seizures -suicidal thoughts or a previous suicide attempt -Tourette's syndrome -weight loss -an unusual or allergic reaction to bupropion, other medicines, foods, dyes, or preservatives -breast-feeding -pregnant or trying to become pregnant How should I use this medicine? Take this medicine by mouth with a glass of water. Follow the directions on the prescription label. You can take it with or without food. If it upsets your stomach, take it with food. Do not cut, crush or chew this medicine. Take your medicine at regular intervals. If you take this medicine more than once a day, take your second dose at least 8 hours after you take your first dose. To limit difficulty in sleeping, avoid taking this medicine at bedtime. Do not take your medicine more often than directed. Do not stop taking this medicine suddenly except upon the advice of your doctor. Stopping this medicine too quickly may cause serious side effects. A special MedGuide will be given to you by the pharmacist with each prescription and refill. Be sure to read this information carefully each time. Talk to your pediatrician regarding the use of this medicine in children. Special care may be needed. Overdosage: If you think you have  taken too much of this medicine contact a poison control center or emergency room at once. NOTE: This medicine is only for you. Do not share this medicine with others. What if I miss a dose? If you miss a dose, skip the missed dose and take your next tablet at the regular time. There should be at least 8 hours between doses. Do not take double or extra doses. What may interact with this medicine? Do not take this medicine with any of the following medications: -linezolid -MAOIs like Azilect, Carbex, Eldepryl, Marplan, Nardil, and Parnate -methylene blue (injected into a vein) -other medicines that contain bupropion like Wellbutrin This medicine may also interact with the following medications: -alcohol -certain medicines for anxiety or sleep -certain medicines for blood pressure like metoprolol, propranolol -certain medicines for depression or psychotic disturbances -certain medicines for HIV or AIDS like efavirenz, lopinavir, nelfinavir, ritonavir -certain medicines for irregular heart beat like propafenone, flecainide -certain medicines for Parkinson's disease like amantadine, levodopa -certain medicines for seizures like carbamazepine, phenytoin, phenobarbital -cimetidine -clopidogrel -cyclophosphamide -digoxin -furazolidone -isoniazid -nicotine -orphenadrine -procarbazine -steroid medicines like prednisone or cortisone -stimulant medicines for attention disorders, weight loss, or to stay awake -tamoxifen -theophylline -thiotepa -ticlopidine -tramadol -warfarin This list may not describe all possible interactions. Give your health care provider a list of all the medicines, herbs, non-prescription drugs, or dietary supplements you use. Also tell them if you smoke, drink alcohol, or use illegal drugs. Some items may interact with your medicine. What should I watch for while using this medicine? Visit your doctor or health care professional for regular checks on your progress.  This medicine should be used together with a   patient support program. It is important to participate in a behavioral program, counseling, or other support program that is recommended by your health care professional. Patients and their families should watch out for new or worsening thoughts of suicide or depression. Also watch out for sudden changes in feelings such as feeling anxious, agitated, panicky, irritable, hostile, aggressive, impulsive, severely restless, overly excited and hyperactive, or not being able to sleep. If this happens, especially at the beginning of treatment or after a change in dose, call your health care professional. Avoid alcoholic drinks while taking this medicine. Drinking excessive alcoholic beverages, using sleeping or anxiety medicines, or quickly stopping the use of these agents while taking this medicine may increase your risk for a seizure. Do not drive or use heavy machinery until you know how this medicine affects you. This medicine can impair your ability to perform these tasks. Do not take this medicine close to bedtime. It may prevent you from sleeping. Your mouth may get dry. Chewing sugarless gum or sucking hard candy, and drinking plenty of water may help. Contact your doctor if the problem does not go away or is severe. Do not use nicotine patches or chewing gum without the advice of your doctor or health care professional while taking this medicine. You may need to have your blood pressure taken regularly if your doctor recommends that you use both nicotine and this medicine together. What side effects may I notice from receiving this medicine? Side effects that you should report to your doctor or health care professional as soon as possible: -allergic reactions like skin rash, itching or hives, swelling of the face, lips, or tongue -breathing problems -changes in vision -confusion -elevated mood, decreased need for sleep, racing thoughts, impulsive  behavior -fast or irregular heartbeat -hallucinations, loss of contact with reality -increased blood pressure -redness, blistering, peeling or loosening of the skin, including inside the mouth -seizures -suicidal thoughts or other mood changes -unusually weak or tired -vomiting Side effects that usually do not require medical attention (report to your doctor or health care professional if they continue or are bothersome): -constipation -headache -loss of appetite -nausea -tremors -weight loss This list may not describe all possible side effects. Call your doctor for medical advice about side effects. You may report side effects to FDA at 1-800-FDA-1088. Where should I keep my medicine? Keep out of the reach of children. Store at room temperature between 20 and 25 degrees C (68 and 77 degrees F). Protect from light. Keep container tightly closed. Throw away any unused medicine after the expiration date. NOTE: This sheet is a summary. It may not cover all possible information. If you have questions about this medicine, talk to your doctor, pharmacist, or health care provider.  2018 Elsevier/Gold Standard (2015-09-02 13:49:28)  Steps to Quit Smoking Smoking tobacco can be bad for your health. It can also affect almost every organ in your body. Smoking puts you and people around you at risk for many serious long-lasting (chronic) diseases. Quitting smoking is hard, but it is one of the best things that you can do for your health. It is never too late to quit. What are the benefits of quitting smoking? When you quit smoking, you lower your risk for getting serious diseases and conditions. They can include:  Lung cancer or lung disease.  Heart disease.  Stroke.  Heart attack.  Not being able to have children (infertility).  Weak bones (osteoporosis) and broken bones (fractures).  If you have coughing, wheezing,  and shortness of breath, those symptoms may get better when you quit.  You may also get sick less often. If you are pregnant, quitting smoking can help to lower your chances of having a baby of low birth weight. What can I do to help me quit smoking? Talk with your doctor about what can help you quit smoking. Some things you can do (strategies) include:  Quitting smoking totally, instead of slowly cutting back how much you smoke over a period of time.  Going to in-person counseling. You are more likely to quit if you go to many counseling sessions.  Using resources and support systems, such as: ? Agricultural engineer with a Veterinary surgeon. ? Phone quitlines. ? Automotive engineer. ? Support groups or group counseling. ? Text messaging programs. ? Mobile phone apps or applications.  Taking medicines. Some of these medicines may have nicotine in them. If you are pregnant or breastfeeding, do not take any medicines to quit smoking unless your doctor says it is okay. Talk with your doctor about counseling or other things that can help you.  Talk with your doctor about using more than one strategy at the same time, such as taking medicines while you are also going to in-person counseling. This can help make quitting easier. What things can I do to make it easier to quit? Quitting smoking might feel very hard at first, but there is a lot that you can do to make it easier. Take these steps:  Talk to your family and friends. Ask them to support and encourage you.  Call phone quitlines, reach out to support groups, or work with a Veterinary surgeon.  Ask people who smoke to not smoke around you.  Avoid places that make you want (trigger) to smoke, such as: ? Bars. ? Parties. ? Smoke-break areas at work.  Spend time with people who do not smoke.  Lower the stress in your life. Stress can make you want to smoke. Try these things to help your stress: ? Getting regular exercise. ? Deep-breathing exercises. ? Yoga. ? Meditating. ? Doing a body scan. To do this, close your  eyes, focus on one area of your body at a time from head to toe, and notice which parts of your body are tense. Try to relax the muscles in those areas.  Download or buy apps on your mobile phone or tablet that can help you stick to your quit plan. There are many free apps, such as QuitGuide from the [employer] developer for Disease Control and Prevention). You can find more support from smokefree.gov and other websites.  This information is not intended to replace advice given to you by your health care provider. Make sure you discuss any questions you have with your health care provider. Document Released: 01/06/2009 Document Revised: 11/08/2015 Document Reviewed: 07/27/2014 Elsevier Interactive Patient Education  2018 [employer].  Coping with Quitting Smoking Quitting smoking is a physical and mental challenge. You will face cravings, withdrawal symptoms, and temptation. Before quitting, work with your health care provider to make a plan that can help you cope. Preparation can help you quit and keep you from giving in. How can I cope with cravings? Cravings usually last for 5-10 minutes. If you get through it, the craving will pass. Consider taking the following actions to help you cope with cravings:  Keep your mouth busy: ? Chew sugar-free gum. ? Suck on hard candies or a straw. ? Brush your teeth.  Keep your hands and body busy: ?  Immediately change to a different activity when you feel a craving. ? Squeeze or play with a ball. ? Do an activity or a hobby, like making bead jewelry, practicing needlepoint, or working with wood. ? Mix up your normal routine. ? Take a short exercise break. Go for a quick walk or run up and down stairs. ? Spend time in public places where smoking is not allowed.  Focus on doing something kind or helpful for someone else.  Call a friend or family member to talk during a craving.  Join a support group.  Call a quit line, such as 1-800-QUIT-NOW.  Talk  with your health care provider about medicines that might help you cope with cravings and make quitting easier for you.  How can I deal with withdrawal symptoms? Your body may experience negative effects as it tries to get used to not having nicotine in the system. These effects are called withdrawal symptoms. They may include:  Feeling hungrier than normal.  Trouble concentrating.  Irritability.  Trouble sleeping.  Feeling depressed.  Restlessness and agitation.  Craving a cigarette.  To manage withdrawal symptoms:  Avoid places, people, and activities that trigger your cravings.  Remember why you want to quit.  Get plenty of sleep.  Avoid coffee and other caffeinated drinks. These may worsen some of your symptoms.  How can I handle social situations? Social situations can be difficult when you are quitting smoking, especially in the first few weeks. To manage this, you can:  Avoid parties, bars, and other social situations where people might be smoking.  Avoid alcohol.  Leave right away if you have the urge to smoke.  Explain to your family and friends that you are quitting smoking. Ask for understanding and support.  Plan activities with friends or family where smoking is not an option.  What are some ways I can cope with stress? Wanting to smoke may cause stress, and stress can make you want to smoke. Find ways to manage your stress. Relaxation techniques can help. For example:  Breathe slowly and deeply, in through your nose and out through your mouth.  Listen to soothing, relaxing music.  Talk with a family member or friend about your stress.  Light a candle.  Soak in a bath or take a shower.  Think about a peaceful place.  What are some ways I can prevent weight gain? Be aware that many people gain weight after they quit smoking. However, not everyone does. To keep from gaining weight, have a plan in place before you quit and stick to the plan after  you quit. Your plan should include:  Having healthy snacks. When you have a craving, it may help to: ? Eat plain popcorn, crunchy carrots, celery, or other cut vegetables. ? Chew sugar-free gum.  Changing how you eat: ? Eat small portion sizes at meals. ? Eat 4-6 small meals throughout the day instead of 1-2 large meals a day. ? Be mindful when you eat. Do not watch television or do other things that might distract you as you eat.  Exercising regularly: ? Make time to exercise each day. If you do not have time for a long workout, do short bouts of exercise for 5-10 minutes several times a day. ? Do some form of strengthening exercise, like weight lifting, and some form of aerobic exercise, like running or swimming.  Drinking plenty of water or other low-calorie or no-calorie drinks. Drink 6-8 glasses of water daily, or as much as  instructed by your health care provider.  Summary  Quitting smoking is a physical and mental challenge. You will face cravings, withdrawal symptoms, and temptation to smoke again. Preparation can help you as you go through these challenges.  You can cope with cravings by keeping your mouth busy (such as by chewing gum), keeping your body and hands busy, and making calls to family, friends, or a helpline for people who want to quit smoking.  You can cope with withdrawal symptoms by avoiding places where people smoke, avoiding drinks with caffeine, and getting plenty of rest.  Ask your health care provider about the different ways to prevent weight gain, avoid stress, and handle social situations. This information is not intended to replace advice given to you by your health care provider. Make sure you discuss any questions you have with your health care provider. Document Released: 03/09/2016 Document Revised: 03/09/2016 Document Reviewed: 03/09/2016 Elsevier Interactive Patient Education  [employer].

## 2017-09-13 NOTE — Progress Notes (Signed)
Gynecology Annual Exam  PCP: Birdie Sons, MD  Chief Complaint:  Chief Complaint  Patient presents with  . Gynecologic Exam    History of Present Illness:Kendra Anderson. Weld is a 47 year old Caucasian/White female, G0 P0000, who presents for her annual exam. She is having no significant GYN problems.  Her menses are absent on Depo Provera. She has had no spotting.  She denies dysmenorrhea.  The patient's past medical history is notable for a history of DM Type II, morbid obesity, stage 3 CKD,  Hypothyroidism, hyperlipidemia, and hypertension. Dr Gabriel Carina is her endocrinologist and Dr Holley Raring is her internist/kidney doctor.. Dr Alm Bustard is her PCP.  Since her last annual GYN exam dated 09/12/2016, she had an I&D of a left breast infection (epidermal cyst infection) last year and was recently treated for a boil in her groin. In addition she had a colonoscopy 07/08/2017 for chronic diarrhea and there was a hyperplastic polyp. Next due in 5 years. She is not currently sexually active. She has been using depo provera for contraception. Last dose was 09/03/2017 Her most recent pap smear was obtained 09/12/2016  and was with negative cells and negative HPV DNA.  Her most recent mammogram obtained on 09/03/2014 was normal and revealed no significant changes.  There is a positive history of breast cancer in her maternal great aunts. Genetic testing has not been done.  There is no family history of ovarian cancer.  The patient does do monthly self breast exams.  The patient smokes 1/2 ppd.  The patient does not drink alcohol.  The patient does not use illegal drugs.  The patient has not been exercising and currently has been treated for swelling and pain in her right knee. She has an appointment to see an orthopedist. Denies any injury to her knee.  The patient may not get adequate calcium in her diet.  She had a recent cholesterol screen in 2018 that was normal on her statin.   The patient  denies current symptoms of depression.    Review of Systems: Review of Systems  Constitutional: Negative for chills, fever and weight loss.       Positive for 3# weight gain since her last annual  HENT: Negative for congestion, sinus pain and sore throat.   Eyes: Negative for blurred vision and pain.  Respiratory: Negative for hemoptysis, shortness of breath and wheezing.   Cardiovascular: Negative for chest pain, palpitations and leg swelling.  Gastrointestinal: Negative for abdominal pain, blood in stool, diarrhea, heartburn, nausea and vomiting.  Genitourinary: Negative for dysuria, frequency, hematuria and urgency.       Positive for amenorrhea  Musculoskeletal: Positive for joint pain (right knee). Negative for back pain and myalgias.  Skin: Negative for itching and rash.  Neurological: Negative for dizziness, tingling and headaches.  Endo/Heme/Allergies: Negative for environmental allergies and polydipsia. Does not bruise/bleed easily.       Negative for hirsutism   Psychiatric/Behavioral: Negative for depression. The patient is not nervous/anxious and does not have insomnia.     Past Medical History:  Past Medical History:  Diagnosis Date  . Chronic kidney disease   . Depression    mild, no meds  . Diabetes mellitus with nephropathy ([place])   . Family history of breast cancer 10/2016   [facility] genetic testing letter sent  . GERD (gastroesophageal reflux disease)   . Hyperlipidemia   . Hypertension   . Hypothyroidism (acquired)   . IBS (irritable bowel syndrome)   .  Morbid obesity ([place])   . Tobacco abuse     Past Surgical History:  Past Surgical History:  Procedure Laterality Date  . CHOLECYSTECTOMY  2005  . COLONOSCOPY  2011   Dr. Vira Agar  . COLONOSCOPY WITH PROPOFOL N/A 07/08/2017   Procedure: COLONOSCOPY WITH PROPOFOL;  Surgeon: Manya Silvas, MD;  Location: [facility];  Service: Endoscopy;  Laterality: N/A;  . kidney stones removed  2012    Family  History:  Family History  Problem Relation Age of Onset  . Diabetes Mother   . Cancer Mother 33       started in gallbladder and mets to liver and bone  . Diabetes Father   . Stroke Father 100  . Diabetes Sister   . Breast cancer Paternal Aunt 70       5 paternal great aunts all had breast cancer  . Lung cancer Maternal Grandfather 60  . Heart disease Paternal Grandfather     Social History:  Social History   Socioeconomic History  . Marital status: Divorced    Spouse name: Not on file  . Number of children: 0  . Years of education: Not on file  . Highest education level: Not on file  Occupational History  . Occupation: Therapist, art  Social Needs  . Financial resource strain: Not on file  . Food insecurity:    Worry: Not on file    Inability: Not on file  . Transportation needs:    Medical: Not on file    Non-medical: Not on file  Tobacco Use  . Smoking status: Current Every Day Smoker    Packs/day: 0.50    Years: 12.00    Pack years: 6.00    Types: Cigarettes  . Smokeless tobacco: Never Used  Substance and Sexual Activity  . Alcohol use: No    Alcohol/week: 0.0 oz  . Drug use: No  . Sexual activity: Not Currently    Birth control/protection: Injection  Lifestyle  . Physical activity:    Days per week: Not on file    Minutes per session: Not on file  . Stress: Not on file  Relationships  . Social connections:    Talks on phone: Not on file    Gets together: Not on file    Attends religious service: Not on file    Active member of club or organization: Not on file    Attends meetings of clubs or organizations: Not on file    Relationship status: Not on file  . Intimate partner violence:    Fear of current or ex partner: Not on file    Emotionally abused: Not on file    Physically abused: Not on file    Forced sexual activity: Not on file  Other Topics Concern  . Not on file  Social History Narrative  . Not on file    Allergies:  Allergies    Allergen Reactions  . Isometheptene-Dichloral-Apap Hives    MIDRIN    Medications:  Current Outpatient Medications on File Prior to Visit  Medication Sig Dispense Refill  . amLODipine (NORVASC) 10 MG tablet 10 mg.     . atorvastatin (LIPITOR) 20 MG tablet Take 20 mg by mouth daily.     . enalapril (VASOTEC) 10 MG tablet Take 10 mg by mouth daily.     Marland Kitchen glucose blood (ONE TOUCH ULTRA TEST) test strip USE 4 TIMES DAILY    . HUMALOG 100 UNIT/ML injection     . Insulin Human (  INSULIN PUMP) SOLN Inject into the skin.    . Insulin Pen Needle (FIFTY50 PEN NEEDLES) 31G X 8 MM MISC USE ONCE DAILY WITH VICTOZA INJECTION    . LANCETS MICRO THIN 33G MISC Use 1 each 5 (five) times daily.    Marland Kitchen levothyroxine (SYNTHROID, LEVOTHROID) 112 MCG tablet Take 112 mcg by mouth daily before breakfast.     . medroxyPROGESTERone (DEPO-PROVERA) 150 MG/ML injection Inject 1 mL (150 mg total) into the muscle every 3 (three) months. 1 mL 0  . metFORMIN (GLUCOPHAGE) 1000 MG tablet Take 1,000 mg by mouth 2 (two) times daily with a meal.     . Multiple Vitamin (MULTI-VITAMINS) TABS Take 1 tablet by mouth daily.    . predniSONE (DELTASONE) 20 MG tablet Take 1 tablet (20 mg total) by mouth 2 (two) times daily with a meal. 10 tablet 0  . traMADol (ULTRAM) 50 MG tablet Take 1 tablet (50 mg total) by mouth every 8 (eight) hours as needed for up to 5 days. 15 tablet 0  . VICTOZA 18 MG/3ML SOPN      No current facility-administered medications on file prior to visit.    Physical Exam Vitals:BP 132/78   Pulse 84   Ht '5\' 1"'  (1.549 m)   Wt 262 lb (118.8 kg)   LMP  (LMP Unknown)   BMI 49.50 kg/m   General: WF in NAD HEENT: normocephalic, anicteric Neck: no thyroid enlargement, no palpable nodules, no cervical lymphadenopathy  Pulmonary: No increased work of breathing, CTAB Cardiovascular: RRR, with no murmur heard  Breast: Breast symmetrical, no tenderness, no palpable nodules or masses, no skin or nipple retraction  present, no nipple discharge.  No axillary, infraclavicular or supraclavicular lymphadenopathy. Abdomen: Soft, non-tender, obese with large pannus, non-distended.  Umbilicus without lesions.  No hepatomegaly or masses palpable. No evidence of hernia. Genitourinary:  External: Normal external female genitalia.  Normal urethral meatus, normal Bartholin's and Skene's glands.  Multiple epidermal cysts on the labia  Vagina: Normal vaginal mucosa, no evidence of prolapse.    Cervix: Grossly normal in appearance, no bleeding, non-tender, small, nulliparous  Uterus: MP, normal size, shape, and consistency, mobile, and non-tender  Adnexa: No adnexal masses, non-tender. Very difficult exam due to body habitus  Rectal: deferred  Lymphatic: no evidence of inguinal lymphadenopathy Extremities: no edema, erythema, or tenderness; multiple varicose veins seen from knee down bilaterally Neurologic: Grossly intact Psychiatric: mood appropriate, affect full     Assessment: 47 y.o. G0P0000 annual gyn exam Tobacco abuse Morbid obesity  Plan:    1) Breast cancer screening - recommend monthly self breast exam and annual screening mammograms. Mammogram was ordered today.  2) Cervical cancer screening - Pap smear done  3) Contraception - Depo Provera q12weeks. Next injection due 11/26/2017  4) Discussed smoking cessation. Recommended quit line. Discussed use of nicotine patch, Chantix, Wellbutrin. Interested in [employer] like her to check with her nephrologist to see if that is OK to use or whether dose needs to be altered. To let me know after her visit with her physician  5) RTO 1 year for annual and prn  Dalia Heading, CNM

## 2017-09-14 ENCOUNTER — Encounter: Payer: Self-pay | Admitting: Certified Nurse Midwife

## 2017-09-17 LAB — IGP,RFX APTIMA HPV ALL PTH: PAP SMEAR COMMENT: 0

## 2017-10-01 ENCOUNTER — Ambulatory Visit
Admission: RE | Admit: 2017-10-01 | Discharge: 2017-10-01 | Disposition: A | Discharge status: Home / Self Care | Payer: Managed Care, Other (non HMO) | Source: Ambulatory Visit | Attending: Certified Nurse Midwife | Admitting: Certified Nurse Midwife

## 2017-10-01 DIAGNOSIS — Z1231 Encounter for screening mammogram for malignant neoplasm of breast: Secondary | ICD-10-CM | POA: Insufficient documentation

## 2017-10-01 DIAGNOSIS — Z1239 Encounter for other screening for malignant neoplasm of breast: Secondary | ICD-10-CM

## 2017-10-03 ENCOUNTER — Inpatient Hospital Stay
Admission: RE | Admit: 2017-10-03 | Discharge: 2017-10-03 | Disposition: A | Discharge status: Home / Self Care | Payer: Self-pay | Source: Ambulatory Visit | Attending: *Deleted | Admitting: *Deleted

## 2017-10-03 ENCOUNTER — Other Ambulatory Visit: Payer: Self-pay | Admitting: *Deleted

## 2017-10-03 DIAGNOSIS — Z9289 Personal history of other medical treatment: Secondary | ICD-10-CM

## 2017-10-05 DIAGNOSIS — M1711 Unilateral primary osteoarthritis, right knee: Secondary | ICD-10-CM | POA: Insufficient documentation

## 2017-10-14 ENCOUNTER — Ambulatory Visit (INDEPENDENT_AMBULATORY_CARE_PROVIDER_SITE_OTHER): Payer: Managed Care, Other (non HMO) | Admitting: Family Medicine

## 2017-10-14 ENCOUNTER — Encounter: Payer: Self-pay | Admitting: Family Medicine

## 2017-10-14 VITALS — BP 110/60 | HR 98 | Temp 98.3°F | Resp 16 | Wt 261.0 lb

## 2017-10-14 DIAGNOSIS — L0291 Cutaneous abscess, unspecified: Secondary | ICD-10-CM | POA: Diagnosis not present

## 2017-10-14 DIAGNOSIS — L0390 Cellulitis, unspecified: Secondary | ICD-10-CM | POA: Diagnosis not present

## 2017-10-14 MED ORDER — AMOXICILLIN-POT CLAVULANATE 875-125 MG PO TABS: 1.0000 | ORAL_TABLET | Freq: Two times a day (BID) | ORAL | 0 refills | Status: AC

## 2017-10-14 NOTE — Progress Notes (Signed)
Patient: Kendra Anderson Female    DOB: 07/13/1970   47 y.o.   MRN: [id number] Visit Date: 10/14/2017  Today's Provider: Mila Merryonald Anslie Spadafora, MD   Chief Complaint  Patient presents with  . Abscess   Subjective:    HPI  Patient has an abscess on left inner thigh. Patient states abscess has been there for 4 days. Abscess is large. Swollen, red and infected. Has had similar lesions several times in the past and did well with I&D, which she would like done today. No fevers, chills, or sweats. Has no been draining.     Allergies  Allergen Reactions  . Isometheptene-Dichloral-Apap Hives    MIDRIN     Current Outpatient Medications:  .  amLODipine (NORVASC) 10 MG tablet, 10 mg. , Disp: , Rfl:  .  atorvastatin (LIPITOR) 20 MG tablet, Take 20 mg by mouth daily. , Disp: , Rfl:  .  enalapril (VASOTEC) 10 MG tablet, Take 10 mg by mouth daily. , Disp: , Rfl:  .  glucose blood (ONE TOUCH ULTRA TEST) test strip, USE 4 TIMES DAILY, Disp: , Rfl:  .  HUMALOG 100 UNIT/ML injection, , Disp: , Rfl:  .  Insulin Human (INSULIN PUMP) SOLN, Inject into the skin., Disp: , Rfl:  .  Insulin Pen Needle (FIFTY50 PEN NEEDLES) 31G X 8 MM MISC, USE ONCE DAILY WITH VICTOZA INJECTION, Disp: , Rfl:  .  LANCETS MICRO THIN 33G MISC, Use 1 each 5 (five) times daily., Disp: , Rfl:  .  levothyroxine (SYNTHROID, LEVOTHROID) 112 MCG tablet, Take 112 mcg by mouth daily before breakfast. , Disp: , Rfl:  .  medroxyPROGESTERone (DEPO-PROVERA) 150 MG/ML injection, Inject 1 mL (150 mg total) into the muscle every 3 (three) months., Disp: 1 mL, Rfl: 0 .  metFORMIN (GLUCOPHAGE) 1000 MG tablet, Take 1,000 mg by mouth 2 (two) times daily with a meal. , Disp: , Rfl:  .  Multiple Vitamin (MULTI-VITAMINS) TABS, Take 1 tablet by mouth daily., Disp: , Rfl:  .  VICTOZA 18 MG/3ML SOPN, , Disp: , Rfl:  .  predniSONE (DELTASONE) 20 MG tablet, Take 1 tablet (20 mg total) by mouth 2 (two) times daily with a meal. (Patient not taking:  Reported on 10/14/2017), Disp: 10 tablet, Rfl: 0  Review of Systems  Constitutional: Negative for appetite change, chills, fatigue and fever.  Respiratory: Negative for chest tightness and shortness of breath.   Cardiovascular: Negative for chest pain and palpitations.  Gastrointestinal: Negative for abdominal pain, nausea and vomiting.  Neurological: Negative for dizziness and weakness.    Social History   Tobacco Use  . Smoking status: Current Every Day Smoker    Packs/day: 0.50    Years: 12.00    Pack years: 6.00    Types: Cigarettes  . Smokeless tobacco: Never Used  Substance Use Topics  . Alcohol use: No    Alcohol/week: 0.0 oz   Objective:   BP 110/60 (BP Location: Right Arm, Patient Position: Sitting, Cuff Size: Large)   Pulse 98   Temp 98.3 F (36.8 C) (Oral)   Resp 16   Wt 261 lb (118.4 kg)   SpO2 98%   BMI 49.32 kg/m  Vitals:   10/14/17 1604  BP: 110/60  Pulse: 98  Resp: 16  Temp: 98.3 F (36.8 C)  TempSrc: Oral  SpO2: 98%  Weight: 261 lb (118.4 kg)     Physical Exam  General appearance: alert, well developed, well nourished, cooperative and  in no distress Head: Normocephalic, without obvious abnormality, atraumatic Respiratory: Respirations even and unlabored, normal respiratory rate Extremities: No gross deformities Skin: about 1cm abscess underlying 4dm area of tender inflamed surface of right proximal inner thigh.      Assessment & Plan:     1. Abscess Prepped area with isopropyl alcohol. Anesthetized with 2 cc 1% lidocaine with epinephrine. Made 1cm incision over abscess and expressed moderate amount of purulent material. Small piece of iodoform packing placed in incision to augment continued discharge. Packed with sterile gauze and adhesive bandage. Instructed to to keep clean and dry and not to remove bandage for at least 24 hours. Superficial packing will come out with first dressing change and should not require repacking.   2. Cellulitis,  unspecified cellulitis site  - amoxicillin-clavulanate (AUGMENTIN) 875-125 MG tablet; Take 1 tablet by mouth 2 (two) times daily for 10 days.  Dispense: 20 tablet; Refill: 0  Call if symptoms change or if not rapidly improving.           Mila Merry, MD  [facility]

## 2017-10-18 ENCOUNTER — Ambulatory Visit: Payer: Managed Care, Other (non HMO) | Admitting: Family Medicine

## 2017-10-18 ENCOUNTER — Encounter: Payer: Self-pay | Admitting: Family Medicine

## 2017-10-18 VITALS — BP 115/75 | HR 95 | Temp 98.1°F | Resp 16 | Ht 61.0 in | Wt 260.0 lb

## 2017-10-18 DIAGNOSIS — L0390 Cellulitis, unspecified: Secondary | ICD-10-CM

## 2017-10-18 DIAGNOSIS — L0291 Cutaneous abscess, unspecified: Secondary | ICD-10-CM

## 2017-10-18 MED ORDER — SULFAMETHOXAZOLE-TRIMETHOPRIM 800-160 MG PO TABS: 1.0000 | ORAL_TABLET | Freq: Two times a day (BID) | ORAL | 0 refills | Status: AC

## 2017-10-18 NOTE — Progress Notes (Signed)
Patient: Kendra Anderson Female    DOB: 08/10/1970   47 y.o.   MRN: [id number] Visit Date: 10/18/2017  Today's Provider: Mila Merryonald Legend Tumminello, MD   Chief Complaint  Patient presents with  . Follow-up   Subjective:    HPI  Abscess From 10/14/2016-expressed abscess. Instructed to keep clean and dry and not to remove bandage for at least 24 hours.   Cellulitis, unspecified cellulitis site From 10/14/2017 given rx for amoxicillin-clavulanate (AUGMENTIN) 875-125 MG tablet. Advised to call if symptoms change or if not rapidly improving. she has been taking antibiotic without adverse effect.   Patient states she has not been feeling well. She has been running a low grade fever. States that abscess is just as painful now as before I&D.    Allergies  Allergen Reactions  . Isometheptene-Dichloral-Apap Hives    MIDRIN     Current Outpatient Medications:  .  amLODipine (NORVASC) 10 MG tablet, 10 mg. , Disp: , Rfl:  .  amoxicillin-clavulanate (AUGMENTIN) 875-125 MG tablet, Take 1 tablet by mouth 2 (two) times daily for 10 days., Disp: 20 tablet, Rfl: 0 .  atorvastatin (LIPITOR) 20 MG tablet, Take 20 mg by mouth daily. , Disp: , Rfl:  .  enalapril (VASOTEC) 10 MG tablet, Take 10 mg by mouth daily. , Disp: , Rfl:  .  glucose blood (ONE TOUCH ULTRA TEST) test strip, USE 4 TIMES DAILY, Disp: , Rfl:  .  HUMALOG 100 UNIT/ML injection, , Disp: , Rfl:  .  Insulin Human (INSULIN PUMP) SOLN, Inject into the skin., Disp: , Rfl:  .  Insulin Pen Needle (FIFTY50 PEN NEEDLES) 31G X 8 MM MISC, USE ONCE DAILY WITH VICTOZA INJECTION, Disp: , Rfl:  .  LANCETS MICRO THIN 33G MISC, Use 1 each 5 (five) times daily., Disp: , Rfl:  .  levothyroxine (SYNTHROID, LEVOTHROID) 112 MCG tablet, Take 112 mcg by mouth daily before breakfast. , Disp: , Rfl:  .  medroxyPROGESTERone (DEPO-PROVERA) 150 MG/ML injection, Inject 1 mL (150 mg total) into the muscle every 3 (three) months., Disp: 1 mL, Rfl: 0 .  metFORMIN  (GLUCOPHAGE) 1000 MG tablet, Take 1,000 mg by mouth 2 (two) times daily with a meal. , Disp: , Rfl:  .  Multiple Vitamin (MULTI-VITAMINS) TABS, Take 1 tablet by mouth daily., Disp: , Rfl:  .  predniSONE (DELTASONE) 20 MG tablet, Take 1 tablet (20 mg total) by mouth 2 (two) times daily with a meal., Disp: 10 tablet, Rfl: 0 .  VICTOZA 18 MG/3ML SOPN, , Disp: , Rfl:   Review of Systems  Constitutional: Negative for appetite change, chills, fatigue and fever.  Respiratory: Negative for chest tightness and shortness of breath.   Cardiovascular: Negative for chest pain and palpitations.  Gastrointestinal: Negative for abdominal pain, nausea and vomiting.  Neurological: Negative for dizziness and weakness.    Social History   Tobacco Use  . Smoking status: Current Every Day Smoker    Packs/day: 0.50    Years: 12.00    Pack years: 6.00    Types: Cigarettes  . Smokeless tobacco: Never Used  Substance Use Topics  . Alcohol use: No    Alcohol/week: 0.0 oz   Objective:   BP 115/75 (BP Location: Right Arm, Patient Position: Sitting, Cuff Size: Large)   Pulse 95   Temp 98.1 F (36.7 C) (Oral)   Resp 16   Ht 5\' 1"  (1.549 m)   Wt 260 lb (117.9 kg)   SpO2  99%   BMI 49.13 kg/m    Physical Exam  Firm indurated subcutaneous mass underlying incision of I&D from 7/22. No erythema. No discharge. Incision is closed.     Assessment & Plan:     1. Abscess   2. Cellulitis, unspecified cellulitis site Erythema has mostly resolved, but is still very tender. Consider she has been experiencing some low grade fevers will add sulfa antibiotic. Incision was re-opened and deepened today, but there was no more drainage. Packed with iodoform gauze to provide path for drainage. Advised that most of the abscess has solidified and may not drain, although I expect improvement with oral antibiotic. Advised may require surgical excision if not improving with oral antibiotic. She is to return on 10-22-2017 for  packing removal.        Mila Merry, MD  [facility]

## 2017-10-21 NOTE — Progress Notes (Signed)
Patient: Kendra Anderson Female    DOB: 06/19/1970   47 y.o.   MRN: [id number] Visit Date: 10/22/2017  Today's Provider: Mila Merryonald Jericka Kadar, MD   Chief Complaint  Patient presents with  . Follow-up   Subjective:    HPI  Cellulitis, unspecified cellulitis site From 10/18/2017-added sulfa antibiotic. Re-opened, deepened incision and packed with iodoform gauze to provide path for drainage. Advised may require surgical excision if not improving with oral antibiotic. She is to return on 10-22-2017 for packing removal.   Patient states she is still taking prescribed antibiotics. She is feeling much better.    Allergies  Allergen Reactions  . Isometheptene-Dichloral-Apap Hives    MIDRIN     Current Outpatient Medications:  .  amLODipine (NORVASC) 10 MG tablet, 10 mg. , Disp: , Rfl:  .  amoxicillin-clavulanate (AUGMENTIN) 875-125 MG tablet, Take 1 tablet by mouth 2 (two) times daily for 10 days., Disp: 20 tablet, Rfl: 0 .  atorvastatin (LIPITOR) 20 MG tablet, Take 20 mg by mouth daily. , Disp: , Rfl:  .  enalapril (VASOTEC) 10 MG tablet, Take 10 mg by mouth daily. , Disp: , Rfl:  .  glucose blood (ONE TOUCH ULTRA TEST) test strip, USE 4 TIMES DAILY, Disp: , Rfl:  .  HUMALOG 100 UNIT/ML injection, , Disp: , Rfl:  .  Insulin Human (INSULIN PUMP) SOLN, Inject into the skin., Disp: , Rfl:  .  Insulin Pen Needle (FIFTY50 PEN NEEDLES) 31G X 8 MM MISC, USE ONCE DAILY WITH VICTOZA INJECTION, Disp: , Rfl:  .  LANCETS MICRO THIN 33G MISC, Use 1 each 5 (five) times daily., Disp: , Rfl:  .  levothyroxine (SYNTHROID, LEVOTHROID) 112 MCG tablet, Take 112 mcg by mouth daily before breakfast. , Disp: , Rfl:  .  medroxyPROGESTERone (DEPO-PROVERA) 150 MG/ML injection, Inject 1 mL (150 mg total) into the muscle every 3 (three) months., Disp: 1 mL, Rfl: 0 .  metFORMIN (GLUCOPHAGE) 1000 MG tablet, Take 1,000 mg by mouth 2 (two) times daily with a meal. , Disp: , Rfl:  .  Multiple Vitamin  (MULTI-VITAMINS) TABS, Take 1 tablet by mouth daily., Disp: , Rfl:  .  predniSONE (DELTASONE) 20 MG tablet, Take 1 tablet (20 mg total) by mouth 2 (two) times daily with a meal., Disp: 10 tablet, Rfl: 0 .  sulfamethoxazole-trimethoprim (BACTRIM DS,SEPTRA DS) 800-160 MG tablet, Take 1 tablet by mouth 2 (two) times daily for 7 days., Disp: 14 tablet, Rfl: 0 .  VICTOZA 18 MG/3ML SOPN, , Disp: , Rfl:   Review of Systems  Constitutional: Negative for appetite change, chills, fatigue and fever.  Respiratory: Negative for chest tightness and shortness of breath.   Cardiovascular: Negative for chest pain and palpitations.  Gastrointestinal: Negative for abdominal pain, nausea and vomiting.  Neurological: Negative for dizziness and weakness.    Social History   Tobacco Use  . Smoking status: Current Every Day Smoker    Packs/day: 0.50    Years: 12.00    Pack years: 6.00    Types: Cigarettes  . Smokeless tobacco: Never Used  Substance Use Topics  . Alcohol use: No    Alcohol/week: 0.0 oz   Objective:   BP 113/75 (BP Location: Right Arm, Patient Position: Sitting, Cuff Size: Large)   Pulse 91   Temp 98.3 F (36.8 C) (Oral)   Resp 16   Wt 260 lb (117.9 kg)   SpO2 96%   BMI 49.13 kg/m  Vitals:  10/22/17 1337  BP: 113/75  Pulse: 91  Resp: 16  Temp: 98.3 F (36.8 C)  TempSrc: Oral  SpO2: 96%  Weight: 260 lb (117.9 kg)     Physical Exam  Wound with packing absorbed moderate amount purulent material. No bleed. Minimal tenderness of surrounding skin.     Assessment & Plan:     1. Abscess   2. Cellulitis, unspecified cellulitis site  Much better today. Continue antibiotics changed iodoform packing. Wound check in 3 days.        Mila Merry, MD  [facility]

## 2017-10-22 ENCOUNTER — Encounter: Payer: Self-pay | Admitting: Family Medicine

## 2017-10-22 ENCOUNTER — Ambulatory Visit: Payer: Managed Care, Other (non HMO) | Admitting: Family Medicine

## 2017-10-22 VITALS — BP 113/75 | HR 91 | Temp 98.3°F | Resp 16 | Wt 260.0 lb

## 2017-10-22 DIAGNOSIS — L0390 Cellulitis, unspecified: Secondary | ICD-10-CM

## 2017-10-22 DIAGNOSIS — L0291 Cutaneous abscess, unspecified: Secondary | ICD-10-CM

## 2017-10-24 NOTE — Progress Notes (Signed)
Patient: Kendra NakayamaKimberly D Anderson Female    DOB: 02/20/1971   47 y.o.   MRN: [id number] Visit Date: 10/25/2017  Today's Provider: Mila Merryonald Lanayah Gartley, MD   Chief Complaint  Patient presents with  . Abscess    follow up   Subjective:    HPI  Abscess and Cellulitis, unspecified cellulitis site From 10/22/2017-advised to continue antibiotics. Changed iodoform packing. Wound check in 3 days. Patient states she completed the antibiotics and feels much better.     Allergies  Allergen Reactions  . Isometheptene-Dichloral-Apap Hives    MIDRIN     Current Outpatient Medications:  .  amLODipine (NORVASC) 10 MG tablet, 10 mg. , Disp: , Rfl:  .  atorvastatin (LIPITOR) 20 MG tablet, Take 20 mg by mouth daily. , Disp: , Rfl:  .  enalapril (VASOTEC) 10 MG tablet, Take 10 mg by mouth daily. , Disp: , Rfl:  .  glucose blood (ONE TOUCH ULTRA TEST) test strip, USE 4 TIMES DAILY, Disp: , Rfl:  .  HUMALOG 100 UNIT/ML injection, , Disp: , Rfl:  .  Insulin Human (INSULIN PUMP) SOLN, Inject into the skin., Disp: , Rfl:  .  Insulin Pen Needle (FIFTY50 PEN NEEDLES) 31G X 8 MM MISC, USE ONCE DAILY WITH VICTOZA INJECTION, Disp: , Rfl:  .  LANCETS MICRO THIN 33G MISC, Use 1 each 5 (five) times daily., Disp: , Rfl:  .  levothyroxine (SYNTHROID, LEVOTHROID) 112 MCG tablet, Take 112 mcg by mouth daily before breakfast. , Disp: , Rfl:  .  medroxyPROGESTERone (DEPO-PROVERA) 150 MG/ML injection, Inject 1 mL (150 mg total) into the muscle every 3 (three) months., Disp: 1 mL, Rfl: 0 .  metFORMIN (GLUCOPHAGE) 1000 MG tablet, Take 1,000 mg by mouth 2 (two) times daily with a meal. , Disp: , Rfl:  .  Multiple Vitamin (MULTI-VITAMINS) TABS, Take 1 tablet by mouth daily., Disp: , Rfl:  .  predniSONE (DELTASONE) 20 MG tablet, Take 1 tablet (20 mg total) by mouth 2 (two) times daily with a meal., Disp: 10 tablet, Rfl: 0 .  sulfamethoxazole-trimethoprim (BACTRIM DS,SEPTRA DS) 800-160 MG tablet, Take 1 tablet by mouth 2 (two)  times daily for 7 days., Disp: 14 tablet, Rfl: 0 .  VICTOZA 18 MG/3ML SOPN, , Disp: , Rfl:   Review of Systems  Constitutional: Negative for appetite change, chills, fatigue and fever.  Respiratory: Negative for chest tightness and shortness of breath.   Cardiovascular: Negative for chest pain and palpitations.  Gastrointestinal: Negative for abdominal pain, nausea and vomiting.  Skin:       Abscess    Neurological: Negative for dizziness and weakness.    Social History   Tobacco Use  . Smoking status: Current Every Day Smoker    Packs/day: 0.50    Years: 12.00    Pack years: 6.00    Types: Cigarettes  . Smokeless tobacco: Never Used  Substance Use Topics  . Alcohol use: No    Alcohol/week: 0.0 oz   Objective:   BP 109/73 (BP Location: Right Arm, Patient Position: Sitting, Cuff Size: Large)   Pulse 89   Temp 98.1 F (36.7 C) (Oral)   Wt 265 lb 3.2 oz (120.3 kg)   SpO2 98%   BMI 50.11 kg/m  Vitals:   10/25/17 1340  BP: 109/73  Pulse: 89  Temp: 98.1 F (36.7 C)  TempSrc: Oral  SpO2: 98%  Weight: 265 lb 3.2 oz (120.3 kg)     Physical Exam  No erythema. Scant amount of drainage on bandage. No erythema. Non-tender knot under incision.     Assessment & Plan:     Greatly improved. Still scant amount of drainage. Place small of iodoform gauze to keep wound open. She my remover in 4-5 days. finish antibiotic. Call if not continuing to steadily improve.       Mila Merry, MD  [facility]

## 2017-10-25 ENCOUNTER — Encounter: Payer: Self-pay | Admitting: Family Medicine

## 2017-10-25 ENCOUNTER — Ambulatory Visit (INDEPENDENT_AMBULATORY_CARE_PROVIDER_SITE_OTHER): Payer: Managed Care, Other (non HMO) | Admitting: Family Medicine

## 2017-10-25 VITALS — BP 109/73 | HR 89 | Temp 98.1°F | Wt 265.2 lb

## 2017-10-25 DIAGNOSIS — L0291 Cutaneous abscess, unspecified: Secondary | ICD-10-CM

## 2017-11-20 ENCOUNTER — Telehealth: Payer: Self-pay

## 2017-11-20 NOTE — Telephone Encounter (Signed)
Pt had AE 09/13/17. She has apt for Depo Tuesday 11/26/17. Her new rx wasn't sent in. Pt requesting new rx to be sent so she can p/u this weekend before her apt on Tuesday.

## 2017-11-21 ENCOUNTER — Other Ambulatory Visit: Payer: Self-pay | Admitting: Certified Nurse Midwife

## 2017-11-21 DIAGNOSIS — Z3042 Encounter for surveillance of injectable contraceptive: Secondary | ICD-10-CM

## 2017-11-21 MED ORDER — MEDROXYPROGESTERONE ACETATE 150 MG/ML IM SUSP: 150.0000 mg | INTRAMUSCULAR | 3 refills | Status: DC

## 2017-11-21 NOTE — Telephone Encounter (Signed)
RX was sent

## 2017-11-21 NOTE — Telephone Encounter (Signed)
LMVM to notify that her request has been submitted. Pt advised to contact us back with any questions/concerns.

## 2017-11-26 ENCOUNTER — Ambulatory Visit (INDEPENDENT_AMBULATORY_CARE_PROVIDER_SITE_OTHER): Payer: Managed Care, Other (non HMO)

## 2017-11-26 DIAGNOSIS — Z3042 Encounter for surveillance of injectable contraceptive: Secondary | ICD-10-CM

## 2017-11-26 MED ORDER — MEDROXYPROGESTERONE ACETATE 150 MG/ML IM SUSP
150.0000 mg | Freq: Once | INTRAMUSCULAR | Status: AC
Start: 2017-11-26 — End: 2017-11-26
  Administered 2017-11-26: 150 mg via INTRAMUSCULAR

## 2018-02-18 ENCOUNTER — Ambulatory Visit (INDEPENDENT_AMBULATORY_CARE_PROVIDER_SITE_OTHER): Payer: Managed Care, Other (non HMO)

## 2018-02-18 DIAGNOSIS — Z3042 Encounter for surveillance of injectable contraceptive: Secondary | ICD-10-CM | POA: Diagnosis not present

## 2018-02-18 MED ORDER — MEDROXYPROGESTERONE ACETATE 150 MG/ML IM SUSP
150.0000 mg | Freq: Once | INTRAMUSCULAR | Status: AC
  Administered 2018-02-18: 150 mg via INTRAMUSCULAR

## 2018-04-04 ENCOUNTER — Encounter: Payer: Self-pay | Admitting: Family Medicine

## 2018-04-04 ENCOUNTER — Ambulatory Visit: Payer: Managed Care, Other (non HMO) | Admitting: Family Medicine

## 2018-04-04 VITALS — BP 92/60 | HR 89 | Temp 97.9°F | Resp 18 | Wt 260.0 lb

## 2018-04-04 DIAGNOSIS — J40 Bronchitis, not specified as acute or chronic: Secondary | ICD-10-CM

## 2018-04-04 MED ORDER — DOXYCYCLINE HYCLATE 100 MG PO TABS: 100.0000 mg | ORAL_TABLET | Freq: Two times a day (BID) | ORAL | 0 refills | Status: DC

## 2018-04-04 MED ORDER — PREDNISONE 10 MG PO TABS: ORAL_TABLET | ORAL | 0 refills | Status: AC

## 2018-04-04 MED ORDER — ALBUTEROL SULFATE HFA 108 (90 BASE) MCG/ACT IN AERS: 2.0000 | INHALATION_SPRAY | Freq: Four times a day (QID) | RESPIRATORY_TRACT | 0 refills | Status: DC | PRN

## 2018-04-04 NOTE — Progress Notes (Signed)
Patient: Kendra Anderson Female    DOB: 09/27/1970   47 y.o.   MRN: [id number] Visit Date: 04/04/2018  Today's Provider: Mila Merryonald Fisher, MD   Chief Complaint  Patient presents with  . Cough    x 2 weeks   Subjective:     Cough  This is a new problem. Episode onset: 2 weeks ago; Patient was seen by an Urgent Care, and was prescribed a Z pack and an inhaler. The problem has been gradually worsening. The cough is productive of sputum (clear colored sputum). Associated symptoms include a fever (low grade at night), nasal congestion, postnasal drip, rhinorrhea, a sore throat and wheezing. Pertinent negatives include no chest pain, chills, ear congestion, ear pain, headaches, hemoptysis, myalgias, shortness of breath or sweats. Treatments tried: Z pack and an inhaler. The treatment provided moderate relief.  she states symptoms mostly resolved, but came back even worse the last 2-3 days.   Allergies  Allergen Reactions  . Isometheptene-Dichloral-Apap Hives    MIDRIN     Current Outpatient Medications:  .  amLODipine (NORVASC) 10 MG tablet, 10 mg. , Disp: , Rfl:  .  atorvastatin (LIPITOR) 20 MG tablet, Take 20 mg by mouth daily. , Disp: , Rfl:  .  enalapril (VASOTEC) 10 MG tablet, Take 10 mg by mouth daily. , Disp: , Rfl:  .  glucose blood (ONE TOUCH ULTRA TEST) test strip, USE 4 TIMES DAILY, Disp: , Rfl:  .  HUMALOG 100 UNIT/ML injection, , Disp: , Rfl:  .  Insulin Human (INSULIN PUMP) SOLN, Inject into the skin., Disp: , Rfl:  .  Insulin Pen Needle (FIFTY50 PEN NEEDLES) 31G X 8 MM MISC, USE ONCE DAILY WITH VICTOZA INJECTION, Disp: , Rfl:  .  LANCETS MICRO THIN 33G MISC, Use 1 each 5 (five) times daily., Disp: , Rfl:  .  levothyroxine (SYNTHROID, LEVOTHROID) 112 MCG tablet, Take 112 mcg by mouth daily before breakfast. , Disp: , Rfl:  .  medroxyPROGESTERone (DEPO-PROVERA) 150 MG/ML injection, Inject 1 mL (150 mg total) into the muscle every 3 (three) months., Disp: 1 mL, Rfl:  3 .  metFORMIN (GLUCOPHAGE) 1000 MG tablet, Take 1,000 mg by mouth 2 (two) times daily with a meal. , Disp: , Rfl:  .  Multiple Vitamin (MULTI-VITAMINS) TABS, Take 1 tablet by mouth daily., Disp: , Rfl:  .  VICTOZA 18 MG/3ML SOPN, , Disp: , Rfl:   Review of Systems  Constitutional: Positive for fatigue and fever (low grade at night). Negative for appetite change and chills.  HENT: Positive for congestion (nasal), postnasal drip, rhinorrhea, sneezing, sore throat and voice change. Negative for ear pain, nosebleeds, sinus pressure and sinus pain.   Respiratory: Positive for cough and wheezing. Negative for hemoptysis, chest tightness and shortness of breath.   Cardiovascular: Negative for chest pain and palpitations.  Gastrointestinal: Negative for abdominal pain, nausea and vomiting.  Musculoskeletal: Negative for myalgias.  Neurological: Negative for dizziness, weakness and headaches.    Social History   Tobacco Use  . Smoking status: Current Every Day Smoker    Packs/day: 0.25    Years: 12.00    Pack years: 3.00    Types: Cigarettes  . Smokeless tobacco: Never Used  Substance Use Topics  . Alcohol use: No    Alcohol/week: 0.0 standard drinks      Objective:   BP 92/60 (BP Location: Right Wrist, Patient Position: Sitting, Cuff Size: Normal)   Pulse 89   Temp  97.9 F (36.6 C) (Oral)   Resp 18   Wt 260 lb (117.9 kg)   SpO2 99% Comment: room air  BMI 49.13 kg/m  Vitals:   04/04/18 1051  BP: 92/60  Pulse: 89  Resp: 18  Temp: 97.9 F (36.6 C)  TempSrc: Oral  SpO2: 99%  Weight: 260 lb (117.9 kg)     Physical Exam   General Appearance:    Alert, cooperative, no distress  HENT:   bilateral TM normal without fluid or infection, neck without nodes, throat normal without erythema or exudate, sinuses nontender and nasal mucosa pale and congested  Eyes:    PERRL, conjunctiva/corneas clear, EOM's intact       Lungs:     Occasional expiratory wheeze, no rales, ,  respirations unlabored  Heart:    Regular rate and rhythm  Neurologic:   Awake, alert, oriented x 3. No apparent focal neurological           defect.          Assessment & Plan    1. Bronchitis  - doxycycline (VIBRA-TABS) 100 MG tablet; Take 1 tablet (100 mg total) by mouth 2 (two) times daily.  Dispense: 20 tablet; Refill: 0 - albuterol (PROVENTIL HFA;VENTOLIN HFA) 108 (90 Base) MCG/ACT inhaler; Inhale 2 puffs into the lungs every 6 (six) hours as needed for wheezing.  Dispense: 1 Inhaler; Refill: 0 - predniSONE (DELTASONE) 10 MG tablet; 6 tablets for 1 day, then 5 for 1 day, then 4 for 1 day, then 3 for 1 day, then 2 for 1 day then 1 for 1 day.  Dispense: 21 tablet; Refill: 0  Call if symptoms change or if not rapidly improving.       Mila Merryonald Fisher, MD  [facility]

## 2018-04-04 NOTE — Patient Instructions (Signed)
.   Please bring all of your medications to every appointment so we can make sure that our medication list is the same as yours.   

## 2018-04-24 ENCOUNTER — Other Ambulatory Visit: Payer: Self-pay | Admitting: Nephrology

## 2018-04-24 DIAGNOSIS — R109 Unspecified abdominal pain: Secondary | ICD-10-CM

## 2018-04-24 DIAGNOSIS — N200 Calculus of kidney: Secondary | ICD-10-CM

## 2018-05-02 ENCOUNTER — Ambulatory Visit
Admission: RE | Admit: 2018-05-02 | Discharge: 2018-05-02 | Disposition: A | Discharge status: Home / Self Care | Payer: Managed Care, Other (non HMO) | Source: Ambulatory Visit | Attending: Nephrology | Admitting: Nephrology

## 2018-05-02 DIAGNOSIS — R109 Unspecified abdominal pain: Secondary | ICD-10-CM | POA: Diagnosis present

## 2018-05-02 DIAGNOSIS — N200 Calculus of kidney: Secondary | ICD-10-CM | POA: Diagnosis present

## 2018-05-13 ENCOUNTER — Ambulatory Visit (INDEPENDENT_AMBULATORY_CARE_PROVIDER_SITE_OTHER): Payer: Managed Care, Other (non HMO)

## 2018-05-13 DIAGNOSIS — Z3042 Encounter for surveillance of injectable contraceptive: Secondary | ICD-10-CM

## 2018-05-13 MED ORDER — MEDROXYPROGESTERONE ACETATE 150 MG/ML IM SUSP
150.0000 mg | Freq: Once | INTRAMUSCULAR | Status: AC
  Administered 2018-05-13: 150 mg via INTRAMUSCULAR

## 2018-06-10 ENCOUNTER — Telehealth: Payer: Self-pay

## 2018-06-10 NOTE — Telephone Encounter (Signed)
Called patient for COVID-19 screening.  Have you recently traveled any where out of the local area in the last 2 weeks? no  Have you been in close contact with a person diagnosed with COVID-19 within the last 2 weeks?no  Do you currently have any fever, cough, or shortness of breath?no   Okay to proceed with visit.    

## 2018-06-11 ENCOUNTER — Ambulatory Visit (INDEPENDENT_AMBULATORY_CARE_PROVIDER_SITE_OTHER): Payer: Managed Care, Other (non HMO) | Admitting: Internal Medicine

## 2018-06-11 ENCOUNTER — Encounter: Payer: Self-pay | Admitting: Internal Medicine

## 2018-06-11 ENCOUNTER — Other Ambulatory Visit: Payer: Self-pay

## 2018-06-11 VITALS — BP 130/78 | HR 85 | Ht 61.0 in | Wt 260.4 lb

## 2018-06-11 DIAGNOSIS — R918 Other nonspecific abnormal finding of lung field: Secondary | ICD-10-CM

## 2018-06-11 DIAGNOSIS — F17210 Nicotine dependence, cigarettes, uncomplicated: Secondary | ICD-10-CM

## 2018-06-11 DIAGNOSIS — R9389 Abnormal findings on diagnostic imaging of other specified body structures: Secondary | ICD-10-CM

## 2018-06-11 NOTE — Progress Notes (Signed)
Name: Kendra Anderson MRN: [id number] DOB: Apr 13, 1970     CONSULTATION DATE: 06/11/2018  REFERRING MD : Sherrie Mustache  CHIEF COMPLAINT: abnormal CT chest  HISTORY OF PRESENT ILLNESS: 48 yo morbidly obese white female seen today for abnormal CT ABD/PELVIS Patient has history of kidney stones Underwent CT abd/pelvis Found to have incidental finding of vague GGO in LLL approx 7 MM  Patient is smoker 1/2 PPD for 20 years  Patient has had +bronchitis in dec 2019 +bronchitis Dec 2018  Patient responded to ABX and Prednisone  Patient has no acute resp issues at this time   Smoking Assessment and Cessation Counseling   Upon further questioning, Patient smokes 1/2 PPD  I have advised patient to quit/stop smoking as soon as possible due to high risk for multiple medical problems  Patient is willing to quit smoking  I have advised patient that we can assist and have options of Nicotine replacement therapy. I also advised patient on behavioral therapy and can provide oral medication therapy in conjunction with the other therapies  Follow up next Office visit  for assessment of smoking cessation  Smoking cessation counseling advised for 4 minutes     PAST MEDICAL HISTORY :   has a past medical history of Chronic kidney disease, Depression, Diabetes mellitus with nephropathy (HCC), Family history of breast cancer (10/2016), GERD (gastroesophageal reflux disease), Hyperlipidemia, Hypertension, Hypothyroidism (acquired), IBS (irritable bowel syndrome), Morbid obesity (HCC), and Tobacco abuse.  has a past surgical history that includes kidney stones removed (2012); Cholecystectomy (2005); Colonoscopy (2011); and Colonoscopy with propofol (N/A, 07/08/2017). Prior to Admission medications   Medication Sig Start Date End Date Taking? Authorizing Provider  amLODipine (NORVASC) 10 MG tablet 10 mg.  07/05/14  Yes [provider]  atorvastatin (LIPITOR) 20 MG tablet Take 20 mg by mouth  daily.  05/31/14  Yes [provider]  doxycycline (VIBRA-TABS) 100 MG tablet Take 1 tablet (100 mg total) by mouth 2 (two) times daily. 04/04/18  Yes Malva Limes, MD  enalapril (VASOTEC) 10 MG tablet Take 10 mg by mouth daily.  07/05/14  Yes [provider]  glucose blood (ONE TOUCH ULTRA TEST) test strip USE 4 TIMES DAILY 01/20/16  Yes [provider]  HUMALOG 100 UNIT/ML injection  08/16/16  Yes [provider]  Insulin Human (INSULIN PUMP) SOLN Inject into the skin.   Yes [provider]  Insulin Pen Needle (FIFTY50 PEN NEEDLES) 31G X 8 MM MISC USE ONCE DAILY WITH VICTOZA INJECTION 02/28/15  Yes [provider]  LANCETS MICRO THIN 33G MISC Use 1 each 5 (five) times daily. 02/28/15  Yes [provider]  levothyroxine (SYNTHROID, LEVOTHROID) 112 MCG tablet Take 112 mcg by mouth daily before breakfast.  05/31/14  Yes [provider]  medroxyPROGESTERone (DEPO-PROVERA) 150 MG/ML injection Inject 1 mL (150 mg total) into the muscle every 3 (three) months. 11/21/17  Yes Farrel Conners, CNM  metFORMIN (GLUCOPHAGE) 1000 MG tablet Take 1,000 mg by mouth 2 (two) times daily with a meal.  05/31/14  Yes [provider]  Multiple Vitamin (MULTI-VITAMINS) TABS Take 1 tablet by mouth daily.   Yes [provider]  VICTOZA 18 MG/3ML SOPN  04/23/14  Yes [provider]  albuterol (PROVENTIL HFA;VENTOLIN HFA) 108 (90 Base) MCG/ACT inhaler Inhale 2 puffs into the lungs every 6 (six) hours as needed for wheezing. Patient not taking: Reported on 06/11/2018 04/04/18   Malva Limes, MD   Allergies  Allergen Reactions  Isometheptene-Dichloral-Apap Hives    MIDRIN    FAMILY HISTORY:  family history includes Breast cancer in an other family member; Cancer (age of onset: 61) in her mother; Diabetes in her father, mother, and sister; Heart disease in her paternal grandfather; Lung cancer (age of onset: 71) in her  maternal grandfather; Stroke (age of onset: 52) in her father. SOCIAL HISTORY:  reports that she has been smoking cigarettes. She has a 3.00 pack-year smoking history. She has never used smokeless tobacco. She reports that she does not drink alcohol or use drugs.  REVIEW OF SYSTEMS:   Constitutional: Negative for fever, chills, weight loss, malaise/fatigue and diaphoresis.  HENT: Negative for hearing loss, ear pain, nosebleeds, congestion, sore throat, neck pain, tinnitus and ear discharge.   Eyes: Negative for blurred vision, double vision, photophobia, pain, discharge and redness.  Respiratory: Negative for cough, hemoptysis, sputum production, shortness of breath, wheezing and stridor.   Cardiovascular: Negative for chest pain, palpitations, orthopnea, claudication, leg swelling and PND.  Gastrointestinal: Negative for heartburn, nausea, vomiting, abdominal pain, diarrhea, constipation, blood in stool and melena.  Genitourinary: Negative for dysuria, urgency, frequency, hematuria and flank pain.  Musculoskeletal: Negative for myalgias, back pain, joint pain and falls.  Skin: Negative for itching and rash.  Neurological: Negative for dizziness, tingling, tremors, sensory change, speech change, focal weakness, seizures, loss of consciousness, weakness and headaches.  Endo/Heme/Allergies: Negative for environmental allergies and polydipsia. Does not bruise/bleed easily.  ALL OTHER ROS ARE NEGATIVE    BP 130/78 (BP Location: Left Arm)    Pulse 85    Ht  (1.549 m)    Wt 260 lb 6.4 oz (118.1 kg)    SpO2 100%    BMI 49.20 kg/m    Physical Examination:   GENERAL:NAD, no fevers, chills, no weakness no fatigue HEAD: Normocephalic, atraumatic.  EYES: Pupils equal, round, reactive to light. Extraocular muscles intact. No scleral icterus.  MOUTH: Moist mucosal membrane.   EAR, NOSE, THROAT: Clear without exudates. No external lesions.  NECK: Supple. No thyromegaly. No nodules. No JVD.    PULMONARY:CTA B/L no wheezes, no crackles, no rhonchi CARDIOVASCULAR: S1 and S2. Regular rate and rhythm. No murmurs, rubs, or gallops. No edema.  GASTROINTESTINAL: Soft, nontender, nondistended. No masses. Positive bowel sounds.  MUSCULOSKELETAL: No swelling, clubbing, or edema. Range of motion full in all extremities.  NEUROLOGIC: Cranial nerves II through XII are intact. No gross focal neurological deficits.  SKIN: No ulceration, lesions, rashes, or cyanosis. Skin warm and dry. Turgor intact.  PSYCHIATRIC: Mood, affect within normal limits. The patient is awake, alert and oriented x 3. Insight, judgment intact.     CBC    Component Value Date/Time   WBC 11.0 07/20/2014 1022   RBC 4.88 07/20/2014 1022   HGB 13.4 07/20/2014 1022   HCT 41.0 07/20/2014 1022   PLT 262 07/20/2014 1022   MCV 84 07/20/2014 1022   MCH 27.4 07/20/2014 1022   MCHC 32.6 07/20/2014 1022   RDW 17.8 (H) 07/20/2014 1022   BMP Latest Ref Rng & Units 07/20/2014  Glucose mg/dL 191(Y)  BUN mg/dL 17  Creatinine mg/dL 7.82(N)  Sodium mmol/L 141  Potassium mmol/L 4.0  Chloride mmol/L 107  CO2 mmol/L 26  Calcium mg/dL 9.2        IMAGING     I have Independently reviewed images of  CT ABD/pelvis  Today Interpretation:vague GGO LLL sub centimeter-likely inflammatory at this time   ASSESSMENT AND PLAN SYNOPSIS  Sub centimeter  Subsolid GGO  LLL incidental finding-findings suggest benign process probable inflammatory -Recommend CT chest 3 months   Smoking Assessment and Cessation Counseling Follow up next Office visit  for assessment of smoking cessation Smoking cessation counseling advised for 4 minutes    Patient satisfied with Plan of action and management. All questions answered Follow up in 3 months with repeat CT chest  Amariyon Maynes Santiago Glad, M.D.  [facility]  Medical Director [facility] Medical Director [facility] Cardio-Pulmonary Department

## 2018-06-11 NOTE — Patient Instructions (Signed)
FOLLOW UP CT CHEST IN 3 MONTHS   STOP SMOKING!!!!

## 2018-08-05 ENCOUNTER — Other Ambulatory Visit: Payer: Self-pay

## 2018-08-05 ENCOUNTER — Ambulatory Visit (INDEPENDENT_AMBULATORY_CARE_PROVIDER_SITE_OTHER): Payer: Managed Care, Other (non HMO)

## 2018-08-05 DIAGNOSIS — Z3042 Encounter for surveillance of injectable contraceptive: Secondary | ICD-10-CM

## 2018-08-05 MED ORDER — MEDROXYPROGESTERONE ACETATE 150 MG/ML IM SUSP
150.0000 mg | Freq: Once | INTRAMUSCULAR | Status: AC
  Administered 2018-08-05: 09:00:00 150 mg via INTRAMUSCULAR

## 2018-08-05 NOTE — Progress Notes (Signed)
Pt presented for Depo injection in RUOQ. Pt tolerated well

## 2018-09-12 ENCOUNTER — Ambulatory Visit: Payer: Managed Care, Other (non HMO) | Attending: Internal Medicine

## 2018-10-26 ENCOUNTER — Other Ambulatory Visit: Payer: Self-pay | Admitting: Certified Nurse Midwife

## 2018-10-26 DIAGNOSIS — Z3042 Encounter for surveillance of injectable contraceptive: Secondary | ICD-10-CM

## 2018-10-28 ENCOUNTER — Ambulatory Visit (INDEPENDENT_AMBULATORY_CARE_PROVIDER_SITE_OTHER): Payer: Managed Care, Other (non HMO)

## 2018-10-28 ENCOUNTER — Other Ambulatory Visit: Payer: Self-pay

## 2018-10-28 DIAGNOSIS — Z3042 Encounter for surveillance of injectable contraceptive: Secondary | ICD-10-CM

## 2018-10-28 MED ORDER — MEDROXYPROGESTERONE ACETATE 150 MG/ML IM SUSP
150.0000 mg | Freq: Once | INTRAMUSCULAR | Status: AC
  Administered 2018-10-28: 150 mg via INTRAMUSCULAR

## 2018-11-18 ENCOUNTER — Ambulatory Visit (INDEPENDENT_AMBULATORY_CARE_PROVIDER_SITE_OTHER): Payer: Managed Care, Other (non HMO) | Admitting: Certified Nurse Midwife

## 2018-11-18 ENCOUNTER — Other Ambulatory Visit: Payer: Self-pay

## 2018-11-18 ENCOUNTER — Encounter: Payer: Self-pay | Admitting: Certified Nurse Midwife

## 2018-11-18 VITALS — BP 110/64 | HR 102 | Ht 61.0 in | Wt 269.6 lb

## 2018-11-18 DIAGNOSIS — Z01419 Encounter for gynecological examination (general) (routine) without abnormal findings: Secondary | ICD-10-CM | POA: Diagnosis not present

## 2018-11-18 DIAGNOSIS — Z1239 Encounter for other screening for malignant neoplasm of breast: Secondary | ICD-10-CM

## 2018-11-18 DIAGNOSIS — F17200 Nicotine dependence, unspecified, uncomplicated: Secondary | ICD-10-CM

## 2018-11-18 NOTE — Progress Notes (Addendum)
Gynecology Annual Exam  PCP: Birdie Sons, MD  Chief Complaint:  Chief Complaint  Patient presents with  . Gynecologic Exam    History of Present Illness:Kendra Anderson. Salsberry is a 48 year old Caucasian/White female, G0 P0000, who presents for her annual exam. She is having no significant GYN problems.  Her menses are absent on Depo Provera. She has had no spotting.  She denies dysmenorrhea.  The patient's past medical history is notable for a history of DM Type II, morbid obesity, stage 3 CKD,  Hypothyroidism, hyperlipidemia, and hypertension. Dr Gabriel Carina is her endocrinologist and Dr Holley Raring is her internist/kidney doctor.. Dr Alm Bustard is her PCP.  Since her last annual GYN exam dated 09/13/2017, she had a CT scan as part of an evaluation for left flank pain. The CT scan was normal except for a vague 7 mm LLL nodule. Repeat CT recommended in 6-12 months. Of note, uterus and ovaries appear normal.  She had a colonoscopy 07/08/2017 for chronic diarrhea and there was a hyperplastic polyp. Next due in 5 years. She is not currently sexually active. She has been using depo provera for contraception. Last dose was 10/28/2018 Her most recent pap smear was obtained 09/13/2017 and was NIL Her most recent mammogram obtained on 10/01/2017 was normal and revealed no significant changes.  There is a positive history of breast cancer in her maternal great aunts. Genetic testing has not been done.  There is no family history of ovarian cancer.  The patient does do monthly self breast exams.  The patient smokes 1/2 ppd to 1/4 PPD and has been counseled by the pulmonologist regarding tobacco cessation. She has cut back, but has not stopped. "is too stressed with the Covid pandemic". Has begun taking Celexa. Encouraged to not buy any more cigarettes and to get nicotine gum, lozenges or patches.  The patient does not drink alcohol.  The patient does not use illegal drugs.  The patient has been exercising by  walking. Trying to get 10,000 to 15, 000 steps a day. The patient may  get adequate calcium in her diet.  She had a recent cholesterol screen in 2020 that was normal on her statin. (HDL was a little low at 37). Hemoglobin A1c was 8.3% in January 2020.  The patient denies current symptoms of depression.    Review of Systems: Review of Systems  Constitutional: Negative for chills, fever and weight loss.       Positive for 7# weight gain since her last annual  HENT: Negative for congestion, sinus pain and sore throat.   Eyes: Negative for blurred vision and pain.  Respiratory: Negative for hemoptysis, shortness of breath and wheezing.   Cardiovascular: Negative for chest pain, palpitations and leg swelling.  Gastrointestinal: Negative for abdominal pain, blood in stool, diarrhea, heartburn, nausea and vomiting.  Genitourinary: Negative for dysuria, frequency, hematuria and urgency.       Positive for amenorrhea  Musculoskeletal: Negative for back pain, joint pain and myalgias.  Skin: Negative for itching and rash.  Neurological: Negative for dizziness, tingling and headaches.  Endo/Heme/Allergies: Negative for environmental allergies and polydipsia. Does not bruise/bleed easily.       Negative for hirsutism   Psychiatric/Behavioral: Negative for depression. The patient is nervous/anxious. The patient does not have insomnia.     Past Medical History:  Past Medical History:  Diagnosis Date  . Chronic kidney disease   . Depression    mild, no meds  . Diabetes mellitus  with nephropathy ([place])   . Family history of breast cancer 10/2016   BRCA genetic testing letter sent  . GERD (gastroesophageal reflux disease)   . Hyperlipidemia   . Hypertension   . Hypothyroidism (acquired)   . IBS (irritable bowel syndrome)   . Morbid obesity ([place])   . Tobacco abuse     Past Surgical History:  Past Surgical History:  Procedure Laterality Date  . CHOLECYSTECTOMY  2005  . COLONOSCOPY  2011    Dr. Vira Agar  . COLONOSCOPY WITH PROPOFOL N/A 07/08/2017   Procedure: COLONOSCOPY WITH PROPOFOL;  Surgeon: Manya Silvas, MD;  Location: [facility] END[facility]COPY;  Service: Endoscopy;  Laterality: N/A;  . kidney stones removed  2012    Family History:  Family History  Problem Relation Age of Onset  . Diabetes Mother   . Cancer Mother 14       started in gallbladder and mets to liver and bone  . Diabetes Father   . Stroke Father 19  . Diabetes Sister   . Lung cancer Maternal Grandfather 60  . Heart disease Paternal Grandfather   . Breast cancer Other        paternal great aunts x 5    Social History:  Social History   Socioeconomic History  . Marital status: Divorced    Spouse name: Not on file  . Number of children: 0  . Years of education: Not on file  . Highest education level: Not on file  Occupational History  . Occupation: Therapist, art  Social Needs  . Financial resource strain: Not on file  . Food insecurity    Worry: Not on file    Inability: Not on file  . Transportation needs    Medical: Not on file    Non-medical: Not on file  Tobacco Use  . Smoking status: Current Every Day Smoker    Packs/day: 0.25    Years: 12.00    Pack years: 3.00    Types: Cigarettes  . Smokeless tobacco: Never Used  . Tobacco comment: 1/2 ppd   Substance and Sexual Activity  . Alcohol use: No    Alcohol/week: 0.0 standard drinks  . Drug use: No  . Sexual activity: Not Currently    Birth control/protection: Injection  Lifestyle  . Physical activity    Days per week: Not on file    Minutes per session: Not on file  . Stress: Not on file  Relationships  . Social Herbalist on phone: Not on file    Gets together: Not on file    Attends religious service: Not on file    Active member of club or organization: Not on file    Attends meetings of clubs or organizations: Not on file    Relationship status: Not on file  . Intimate partner violence    Fear of current or  ex partner: Not on file    Emotionally abused: Not on file    Physically abused: Not on file    Forced sexual activity: Not on file  Other Topics Concern  . Not on file  Social History Narrative  . Not on file    Allergies:  Allergies  Allergen Reactions  . Isometheptene-Dichloral-Apap Hives    MIDRIN    Medications:  Current Outpatient Medications on File Prior to Visit  Medication Sig Dispense Refill  . amLODipine (NORVASC) 10 MG tablet 10 mg.     . atorvastatin (LIPITOR) 20 MG tablet Take 20 mg  by mouth daily.     . citalopram (CELEXA) 20 MG tablet Take 1 tablet by mouth daily.    . enalapril (VASOTEC) 10 MG tablet Take 10 mg by mouth daily.     Marland Kitchen glucose blood (ONE TOUCH ULTRA TEST) test strip USE 4 TIMES DAILY    . HUMALOG 100 UNIT/ML injection     . Insulin Human (INSULIN PUMP) SOLN Inject into the skin.    Marland Kitchen insulin lispro (HUMALOG) 100 UNIT/ML injection Inject 100 Units into the skin continuous.    . Insulin Pen Needle (FIFTY50 PEN NEEDLES) 31G X 8 MM MISC USE ONCE DAILY WITH VICTOZA INJECTION    . LANCETS MICRO THIN 33G MISC Use 1 each 5 (five) times daily.    Marland Kitchen levothyroxine (SYNTHROID, LEVOTHROID) 112 MCG tablet Take 112 mcg by mouth daily before breakfast.     . medroxyPROGESTERone Acetate 150 MG/ML SUSY ADMINISTER 1 ML(150 MG) IN THE MUSCLE EVERY 3 MONTHS 1 mL 1  . metFORMIN (GLUCOPHAGE) 1000 MG tablet Take 1,000 mg by mouth 2 (two) times daily with a meal.     . Multiple Vitamin (MULTI-VITAMINS) TABS Take 1 tablet by mouth daily.    Marland Kitchen VICTOZA 18 MG/3ML SOPN      No current facility-administered medications on file prior to visit.    Physical Exam Vitals:BP 110/64   Pulse (!) 102   Ht _0  (1.549 m)   Wt 269 lb 9.6 oz (122.3 kg)   LMP  (LMP Unknown)   BMI 50.94 kg/m   General: WF in NAD HEENT: normocephalic, anicteric Neck: no thyroid enlargement, no palpable nodules, no cervical lymphadenopathy  Pulmonary: No increased work of breathing, CTAB  Cardiovascular: RRR, with no murmur heard  Breast: Breast symmetrical, no tenderness, no palpable nodules or masses, no skin or nipple retraction present, no nipple discharge.  No axillary, infraclavicular or supraclavicular lymphadenopathy. Abdomen: Soft, non-tender, obese with large pannus, non-distended.  Umbilicus without lesions.  No hepatomegaly or masses palpable. No evidence of hernia. Genitourinary:  External: Normal external female genitalia.  Normal urethral meatus, normal Bartholin's and Skene's glands.  Multiple epidermal cysts on the labia   Vagina: Normal vaginal mucosa, no evidence of prolapse.    Cervix: no bleeding, non-tender, small,  Uterus: MP, normal size, shape, and consistency, mobile, and non-tender  Adnexa: No adnexal masses, non-tender. Very difficult exam due to body habitus  Rectal: deferred  Lymphatic: no evidence of inguinal lymphadenopathy Extremities: no edema, erythema, or tenderness; multiple varicose veins seen from knee down bilaterally Neurologic: Grossly intact Psychiatric: mood appropriate, affect full     Assessment: 48 y.o. G0P0000 annual gyn exam Tobacco abuse Morbid obesity  Plan:    1) Breast cancer screening - recommend monthly self breast exam and annual screening mammograms. Mammogram was ordered today.  2) Cervical cancer screening - DIscussed ASCCP recommendations for frequency of Pap smears. Would like to do Pap smears every 3 years. Pap not done today  3) Contraception - Depo Provera q12weeks. Next injection due 01/20/2019  4) Discussed smoking cessation x 5 minutes regarding  Importance of quitting, and use of nicotine replacement to help with the withdrawal symptoms.   5) RTO 1 year for annual and prn  Dalia Heading, CNM

## 2019-01-20 ENCOUNTER — Other Ambulatory Visit: Payer: Self-pay

## 2019-01-20 ENCOUNTER — Ambulatory Visit (INDEPENDENT_AMBULATORY_CARE_PROVIDER_SITE_OTHER): Payer: Managed Care, Other (non HMO)

## 2019-01-20 DIAGNOSIS — Z3042 Encounter for surveillance of injectable contraceptive: Secondary | ICD-10-CM

## 2019-01-20 MED ORDER — MEDROXYPROGESTERONE ACETATE 150 MG/ML IM SUSP
150.0000 mg | Freq: Once | INTRAMUSCULAR | Status: AC
  Administered 2019-01-20: 09:00:00 150 mg via INTRAMUSCULAR

## 2019-04-10 ENCOUNTER — Encounter: Payer: Self-pay | Admitting: Family Medicine

## 2019-04-10 ENCOUNTER — Ambulatory Visit: Payer: Managed Care, Other (non HMO) | Admitting: Family Medicine

## 2019-04-10 ENCOUNTER — Other Ambulatory Visit: Payer: Self-pay

## 2019-04-10 VITALS — BP 157/75 | HR 96 | Temp 97.5°F | Resp 16 | Ht 61.0 in | Wt 273.6 lb

## 2019-04-10 DIAGNOSIS — L732 Hidradenitis suppurativa: Secondary | ICD-10-CM

## 2019-04-10 MED ORDER — AMOXICILLIN-POT CLAVULANATE 875-125 MG PO TABS: 1.0000 | ORAL_TABLET | Freq: Two times a day (BID) | ORAL | 0 refills | Status: DC

## 2019-04-10 NOTE — Progress Notes (Signed)
Patient: Kendra Anderson Female    DOB: [birth year]   49 y.o.   MRN: [id number] Visit Date: 04/10/2019  Today's Provider: Mila Merry, MD   Chief Complaint  Patient presents with  . Abscess   Subjective:     HPI Abscess: Patient complains of an abscess under the left arm x's 3 days. Patient reports pain, denies any discharge. Patient reports low grade temp of 100 at night. Has had similar lesions several times in the past and reports they usually do well with Augmentin.   Allergies  Allergen Reactions  . Isometheptene-Dichloral-Apap Hives    MIDRIN     Current Outpatient Medications:  .  amLODipine (NORVASC) 10 MG tablet, Take 10 mg by mouth daily. , Disp: , Rfl:  .  atorvastatin (LIPITOR) 20 MG tablet, Take 20 mg by mouth daily. , Disp: , Rfl:  .  citalopram (CELEXA) 20 MG tablet, Take 1 tablet by mouth daily., Disp: , Rfl:  .  enalapril (VASOTEC) 10 MG tablet, Take 10 mg by mouth daily. , Disp: , Rfl:  .  glucose blood (ONE TOUCH ULTRA TEST) test strip, USE 4 TIMES DAILY, Disp: , Rfl:  .  HUMALOG 100 UNIT/ML injection, , Disp: , Rfl:  .  HYDROcodone-acetaminophen (NORCO/VICODIN) 5-325 MG tablet, hydrocodone 5 mg-acetaminophen 325 mg tablet  TAKE 1 TABLET BY MOUTH EVERY 6 HOURS FOR 5 DAYS AS NEEDED, Disp: , Rfl:  .  Insulin Human (INSULIN PUMP) SOLN, Inject into the skin., Disp: , Rfl:  .  insulin lispro (HUMALOG) 100 UNIT/ML injection, Inject 100 Units into the skin continuous., Disp: , Rfl:  .  Insulin Pen Needle (FIFTY50 PEN NEEDLES) 31G X 8 MM MISC, USE ONCE DAILY WITH VICTOZA INJECTION, Disp: , Rfl:  .  LANCETS MICRO THIN 33G MISC, Use 1 each 5 (five) times daily., Disp: , Rfl:  .  levothyroxine (SYNTHROID, LEVOTHROID) 112 MCG tablet, Take 112 mcg by mouth daily before breakfast. , Disp: , Rfl:  .  medroxyPROGESTERone Acetate 150 MG/ML SUSY, ADMINISTER 1 ML(150 MG) IN THE MUSCLE EVERY 3 MONTHS, Disp: 1 mL, Rfl: 1 .  metFORMIN (GLUCOPHAGE) 1000 MG tablet, Take  1,000 mg by mouth 2 (two) times daily with a meal. , Disp: , Rfl:  .  Multiple Vitamin (MULTI-VITAMINS) TABS, Take 1 tablet by mouth daily., Disp: , Rfl:  .  VICTOZA 18 MG/3ML SOPN, , Disp: , Rfl:   Review of Systems  Constitutional: Positive for fever.  Respiratory: Negative.   Cardiovascular: Negative.   Skin: Positive for rash.    Social History   Tobacco Use  . Smoking status: Current Every Day Smoker    Packs/day: 0.25    Years: 12.00    Pack years: 3.00    Types: Cigarettes  . Smokeless tobacco: Never Used  . Tobacco comment: 1/2 ppd   Substance Use Topics  . Alcohol use: No    Alcohol/week: 0.0 standard drinks      Objective:   BP (!) 157/75   Pulse 96   Temp (!) 97.5 F (36.4 C) (Temporal)   Resp 16   Ht 5\' 1"  (1.549 m)   Wt 273 lb 9.6 oz (124.1 kg)   BMI 51.70 kg/m  Vitals:   04/10/19 1109 04/10/19 1112  BP: (!) 148/75 (!) 157/75  Pulse: (!) 102 96  Resp: 16   Temp: (!) 97.5 F (36.4 C)   TempSrc: Temporal   Weight: 273 lb 9.6 oz (124.1 kg)  Height: 5\' 1"  (1.549 m)   Body mass index is 51.7 kg/m.   Physical Exam  Grape sized tender cyst left axillae, no discharge, some mild inflammation.  No results found for any visits on 04/10/19.     Assessment & Plan    1. Hidradenitis axillaris  - amoxicillin-clavulanate (AUGMENTIN) 875-125 MG tablet; Take 1 tablet by mouth 2 (two) times daily for 10 days.  Dispense: 20 tablet; Refill: 0   Call if symptoms change or if not rapidly improving.   The entirety of the information documented in the History of Present Illness, Review of Systems and Physical Exam were personally obtained by me. Portions of this information were initially documented by Kendra Anderson, CMA and reviewed by me for thoroughness and accuracy.      Kendra Huh, MD  [facility]

## 2019-04-14 ENCOUNTER — Ambulatory Visit: Payer: Managed Care, Other (non HMO)

## 2019-04-14 ENCOUNTER — Other Ambulatory Visit: Payer: Self-pay | Admitting: Certified Nurse Midwife

## 2019-04-14 DIAGNOSIS — Z3042 Encounter for surveillance of injectable contraceptive: Secondary | ICD-10-CM

## 2019-04-16 ENCOUNTER — Ambulatory Visit (INDEPENDENT_AMBULATORY_CARE_PROVIDER_SITE_OTHER): Payer: Managed Care, Other (non HMO)

## 2019-04-16 ENCOUNTER — Other Ambulatory Visit: Payer: Self-pay

## 2019-04-16 DIAGNOSIS — Z3042 Encounter for surveillance of injectable contraceptive: Secondary | ICD-10-CM | POA: Diagnosis not present

## 2019-04-16 MED ORDER — MEDROXYPROGESTERONE ACETATE 150 MG/ML IM SUSP
150.0000 mg | Freq: Once | INTRAMUSCULAR | Status: AC
  Administered 2019-04-16: 150 mg via INTRAMUSCULAR

## 2019-04-16 NOTE — Progress Notes (Signed)
Pt here for depo which was given IM right glut.  NDC# [address]-2 

## 2019-07-04 ENCOUNTER — Ambulatory Visit: Payer: Managed Care, Other (non HMO)

## 2019-07-08 ENCOUNTER — Other Ambulatory Visit: Payer: Self-pay

## 2019-07-08 ENCOUNTER — Ambulatory Visit (INDEPENDENT_AMBULATORY_CARE_PROVIDER_SITE_OTHER): Payer: Managed Care, Other (non HMO)

## 2019-07-08 ENCOUNTER — Telehealth: Payer: Self-pay | Admitting: Certified Nurse Midwife

## 2019-07-08 DIAGNOSIS — Z3042 Encounter for surveillance of injectable contraceptive: Secondary | ICD-10-CM

## 2019-07-08 MED ORDER — MEDROXYPROGESTERONE ACETATE 150 MG/ML IM SUSY: 1.0000 mL | PREFILLED_SYRINGE | INTRAMUSCULAR | 1 refills | Status: DC

## 2019-07-08 MED ORDER — MEDROXYPROGESTERONE ACETATE 150 MG/ML IM SUSP
150.0000 mg | Freq: Once | INTRAMUSCULAR | Status: AC
Start: 2019-07-08 — End: 2019-07-08
  Administered 2019-07-08: 10:00:00 150 mg via INTRAMUSCULAR

## 2019-07-08 NOTE — Telephone Encounter (Signed)
Refilled for pt  

## 2019-07-08 NOTE — Telephone Encounter (Signed)
Patient will need refill for next depo scheduled 7/7.  She is scheduled for her annual with [facility] 8/26.  Preferred pharmacy [facility] [address].

## 2019-07-08 NOTE — Progress Notes (Signed)
Patient presents today for Depo Provera injection within dates. Given IM LUOQ. Patient tolerated well. 

## 2019-07-09 ENCOUNTER — Ambulatory Visit: Payer: Managed Care, Other (non HMO)

## 2019-08-03 ENCOUNTER — Telehealth: Payer: Self-pay

## 2019-08-03 DIAGNOSIS — L732 Hidradenitis suppurativa: Secondary | ICD-10-CM

## 2019-08-03 MED ORDER — AMOXICILLIN-POT CLAVULANATE 875-125 MG PO TABS: 1.0000 | ORAL_TABLET | Freq: Two times a day (BID) | ORAL | 0 refills | Status: DC

## 2019-08-03 NOTE — Telephone Encounter (Signed)
Patient advised that medication was sent in to [facility].

## 2019-08-03 NOTE — Telephone Encounter (Signed)
Copied from CRM [id number]. Topic: General - Inquiry >> Aug 03, 2019  1:54 PM Leary Roca wrote: Reason for CRM: Pt is requesting an antibiotic . Please advise

## 2019-08-03 NOTE — Addendum Note (Signed)
Addended by: Malva Limes on: 08/03/2019 02:55 PM   Modules accepted: Orders

## 2019-08-03 NOTE — Telephone Encounter (Signed)
Have sent refill Aug[facility] to [facility].

## 2019-08-03 NOTE — Telephone Encounter (Signed)
Patient was seen in January for an abscess under her arm.  She states that it has come back in the same place.  She wanted to know if you would be willing to treat her without a visit.

## 2019-08-03 NOTE — Telephone Encounter (Signed)
We do not typically call in antibiotics.  Need to know why she thinks she needs an antibiotic and set her up an appointment  [facility]

## 2019-09-30 ENCOUNTER — Ambulatory Visit: Payer: Managed Care, Other (non HMO)

## 2019-10-06 ENCOUNTER — Ambulatory Visit (INDEPENDENT_AMBULATORY_CARE_PROVIDER_SITE_OTHER): Payer: Managed Care, Other (non HMO)

## 2019-10-06 ENCOUNTER — Other Ambulatory Visit: Payer: Self-pay

## 2019-10-06 DIAGNOSIS — Z3042 Encounter for surveillance of injectable contraceptive: Secondary | ICD-10-CM

## 2019-10-06 MED ORDER — MEDROXYPROGESTERONE ACETATE 150 MG/ML IM SUSP
150.0000 mg | Freq: Once | INTRAMUSCULAR | Status: AC
  Administered 2019-10-06: 150 mg via INTRAMUSCULAR

## 2019-10-06 NOTE — Progress Notes (Signed)
Pt here for depo which was given IM right glut.  NDC# [address]-2 

## 2019-10-15 LAB — HEMOGLOBIN A1C: Hemoglobin A1C: 8.5

## 2019-11-02 ENCOUNTER — Other Ambulatory Visit: Payer: Self-pay | Admitting: Certified Nurse Midwife

## 2019-11-02 DIAGNOSIS — Z1231 Encounter for screening mammogram for malignant neoplasm of breast: Secondary | ICD-10-CM

## 2019-11-04 ENCOUNTER — Encounter: Payer: Self-pay | Admitting: Family Medicine

## 2019-11-18 ENCOUNTER — Ambulatory Visit
Admission: RE | Admit: 2019-11-18 | Discharge: 2019-11-18 | Disposition: A | Discharge status: Home / Self Care | Payer: Managed Care, Other (non HMO) | Source: Ambulatory Visit | Attending: Certified Nurse Midwife | Admitting: Certified Nurse Midwife

## 2019-11-18 ENCOUNTER — Other Ambulatory Visit: Payer: Self-pay

## 2019-11-18 DIAGNOSIS — Z1231 Encounter for screening mammogram for malignant neoplasm of breast: Secondary | ICD-10-CM | POA: Diagnosis not present

## 2019-11-19 ENCOUNTER — Ambulatory Visit: Payer: Managed Care, Other (non HMO) | Admitting: Certified Nurse Midwife

## 2019-11-20 ENCOUNTER — Ambulatory Visit (INDEPENDENT_AMBULATORY_CARE_PROVIDER_SITE_OTHER): Payer: Managed Care, Other (non HMO) | Admitting: Obstetrics and Gynecology

## 2019-11-20 ENCOUNTER — Encounter: Payer: Self-pay | Admitting: Obstetrics and Gynecology

## 2019-11-20 ENCOUNTER — Other Ambulatory Visit: Payer: Self-pay

## 2019-11-20 VITALS — BP 120/60 | Ht 61.0 in | Wt 275.0 lb

## 2019-11-20 DIAGNOSIS — Z1339 Encounter for screening examination for other mental health and behavioral disorders: Secondary | ICD-10-CM

## 2019-11-20 DIAGNOSIS — Z3042 Encounter for surveillance of injectable contraceptive: Secondary | ICD-10-CM | POA: Diagnosis not present

## 2019-11-20 DIAGNOSIS — Z01419 Encounter for gynecological examination (general) (routine) without abnormal findings: Secondary | ICD-10-CM | POA: Diagnosis not present

## 2019-11-20 DIAGNOSIS — R011 Cardiac murmur, unspecified: Secondary | ICD-10-CM

## 2019-11-20 DIAGNOSIS — Z1331 Encounter for screening for depression: Secondary | ICD-10-CM

## 2019-11-20 MED ORDER — MEDROXYPROGESTERONE ACETATE 150 MG/ML IM SUSY: 1.0000 mL | PREFILLED_SYRINGE | INTRAMUSCULAR | 4 refills | Status: DC

## 2019-11-20 NOTE — Progress Notes (Signed)
Gynecology Annual Exam  PCP: Birdie Sons, MD  Chief Complaint  Patient presents with   Gynecologic Exam    no concerns   [employer] Employee    History of Present Illness:  Ms. Kendra Anderson is a 49 y.o. G0P0000 who LMP was No LMP recorded. Patient has had an injection., presents today for her annual examination.  Her menses are absent on Depo Provera.    She does have vasomotor sx.   She is not sexually active. She does not have vaginal dryness.  She has hot flashes daily and mood swings.   Last Pap: 09/13/2017  Results were: no abnormalities, HPV DNA not done (last done and negative in 2018) Hx of STDs: none  Last mammogram: 11/18/2019  Results were: normal--routine follow-up in 12 months There is no FH of breast cancer. There is no FH of ovarian cancer. The patient does do self-breast exams.  Colonoscopy: 2019, follow up in 3 years due to hyperplastic polyps.  DEXA: has not been screened   Tobacco use: smoked 1/2 ppd  Alcohol use: none Exercise: she has a tear in her meniscus. She does a little walking and some stationary bike.  The patient wears seatbelts: yes.     She thinks that she might be going through menopause.  She has hot flashes and has mood swings.   The patient's past medical history is notable for a history of DM Type II, morbid obesity, stage 3 CKD,  Hypothyroidism, hyperlipidemia, and hypertension. Dr Gabriel Carina is her endocrinologist and Dr Holley Raring is her internist/kidney doctor.. Dr Alm Bustard is her PCP.   Past Medical History:  Diagnosis Date   Chronic kidney disease    Depression    mild, no meds   Diabetes mellitus with nephropathy ([place])    Family history of breast cancer 10/2016   BRCA genetic testing letter sent   GERD (gastroesophageal reflux disease)    Hyperlipidemia    Hypertension    Hypothyroidism (acquired)    IBS (irritable bowel syndrome)    Morbid obesity ([place])    Tobacco abuse     Past Surgical History:  Procedure  Laterality Date   CHOLECYSTECTOMY  2005   COLONOSCOPY  2011   Dr. Vira Agar   COLONOSCOPY WITH PROPOFOL N/A 07/08/2017   Procedure: COLONOSCOPY WITH PROPOFOL;  Surgeon: Manya Silvas, MD;  Location: [facility] ENDOSCOPY;  Service: Endoscopy;  Laterality: N/A;   kidney stones removed  2012    Prior to Admission medications   Medication Sig Start Date End Date Taking? Authorizing Provider  amLODipine (NORVASC) 10 MG tablet Take 10 mg by mouth daily.  07/05/14  Yes [provider]  amoxicillin (AMOXIL) 875 MG tablet amoxicillin 875 mg tablet   Yes [provider]  atorvastatin (LIPITOR) 40 MG tablet Take 40 mg by mouth daily. 10/20/19  Yes [provider]  enalapril (VASOTEC) 10 MG tablet Take 10 mg by mouth daily.  07/05/14  Yes [provider]  glucose blood (ONE TOUCH ULTRA TEST) test strip USE 4 TIMES DAILY 01/20/16  Yes [provider]  HUMALOG 100 UNIT/ML injection  08/16/16  Yes [provider]  Insulin Human (INSULIN PUMP) SOLN Inject into the skin.   Yes [provider]  insulin lispro (HUMALOG) 100 UNIT/ML injection Inject 100 Units into the skin continuous.   Yes [provider]  Insulin Pen Needle (FIFTY50 PEN NEEDLES) 31G X 8 MM MISC USE ONCE DAILY WITH [place] INJECTION 02/28/15  Yes [provider]  LANCETS MICRO THIN 33G MISC Use 1 each 5 (five) times daily. 02/28/15  Yes [provider]  levothyroxine (SYNTHROID, LEVOTHROID) 112 MCG tablet Take 112 mcg by mouth daily before breakfast.  05/31/14  Yes [provider]  medroxyPROGESTERone Acetate 150 MG/ML SUSY Inject 1 mL (150 mg total) into the muscle every 3 (three) months. 07/08/19  Yes Dalia Heading, CNM  metFORMIN (GLUCOPHAGE) 1000 MG tablet Take 1,000 mg by mouth 2 (two) times daily with a meal.  05/31/14  Yes [provider]  Multiple Vitamin (MULTI-VITAMINS) TABS Take 1 tablet by mouth daily.   Yes [provider]   traMADol (ULTRAM) 50 MG tablet Take 50 mg by mouth every 6 (six) hours as needed. 11/02/19  Yes [provider]  [place] 18 MG/3ML SOPN  04/23/14  Yes [provider]  Continuous Blood Gluc Receiver (FREESTYLE LIBRE 14 DAY READER) DEVI Use 1 Device once daily Patient not taking: Reported on 11/20/2019 05/13/19   [provider]  Continuous Blood Gluc Sensor (FREESTYLE LIBRE 14 DAY SENSOR) MISC Use 1 kit every 14 (fourteen) days Patient not taking: Reported on 11/20/2019 05/13/19   [provider]  Semaglutide, 1 MG/DOSE, (OZEMPIC, 1 MG/DOSE,) 2 MG/1.5ML SOPN Ozempic 1 mg/dose (2 mg/1.5 mL) subcutaneous pen injector Patient not taking: Reported on 11/20/2019 05/13/19   [provider]   Allergies  Allergen Reactions   Isometheptene-Dichloral-Apap Hives    MIDRIN   Obstetric History: G0P0000  Family History  Problem Relation Age of Onset   Diabetes Mother    Cancer Mother 74       started in gallbladder and mets to liver and bone   Diabetes Father    Stroke Father 2   Diabetes Sister    Lung cancer Maternal Grandfather 66   Heart disease Paternal Grandfather    Breast cancer Other        paternal great aunts x 5    Social History   Socioeconomic History   Marital status: Divorced    Spouse name: Not on file   Number of children: 0   Years of education: Not on file   Highest education level: Not on file  Occupational History   Occupation: Customer Service  Tobacco Use   Smoking status: Current Every Day Smoker    Packs/day: 0.25    Years: 12.00    Pack years: 3.00    Types: Cigarettes   Smokeless tobacco: Never Used   Tobacco comment: 1/2 ppd   Vaping Use   Vaping Use: Never used  Substance and Sexual Activity   Alcohol use: No    Alcohol/week: 0.0 standard drinks   Drug use: No   Sexual activity: Not Currently    Birth control/protection: Injection  Other Topics Concern   Not on file  Social History  Narrative   Not on file   Social Determinants of Health   Financial Resource Strain:    Difficulty of Paying Living Expenses: Not on file  Food Insecurity:    Worried About Charity fundraiser in the Last Year: Not on file   [employer] of Food in the Last Year: Not on file  Transportation Needs:    Lack of Transportation (Medical): Not on file   Lack of Transportation (Non-Medical): Not on file  Physical Activity:    Days of Exercise per Week: Not on file   Minutes of Exercise per Session: Not on file  Stress:    Feeling of Stress : Not  on file  Social Connections:    Frequency of Communication with Friends and Family: Not on file   Frequency of Social Gatherings with Friends and Family: Not on file   Attends Religious Services: Not on file   Active Member of Clubs or Organizations: Not on file   Attends Archivist Meetings: Not on file   Marital Status: Not on file  Intimate Partner Violence:    Fear of Current or Ex-Partner: Not on file   Emotionally Abused: Not on file   Physically Abused: Not on file   Sexually Abused: Not on file    Review of Systems  Constitutional: Negative.   HENT: Negative.   Eyes: Negative.   Respiratory: Negative.   Cardiovascular: Negative.   Gastrointestinal: Negative.   Genitourinary: Negative.   Musculoskeletal: Negative.   Skin: Negative.   Neurological: Negative.   Psychiatric/Behavioral: Negative.      Physical Exam BP 120/60    Ht '5\' 1"'  (1.549 m)    Wt 275 lb (124.7 kg)    BMI 51.96 kg/m   Physical Exam Constitutional:      General: She is not in acute distress.    Appearance: Normal appearance. She is well-developed.  Genitourinary:     Pelvic exam was performed with patient in the lithotomy position.     Vulva, urethra, bladder and uterus normal.     No inguinal adenopathy present in the right or left side.    No signs of injury in the vagina.     No vaginal discharge, erythema, tenderness or  bleeding.     No cervical motion tenderness, discharge, lesion or polyp.     Uterus is mobile.     Uterus is not enlarged or tender.     No uterine mass detected.    Uterus is anteverted.     No right or left adnexal mass present.     Right adnexa not tender or full.     Left adnexa not tender or full.  HENT:     Head: Normocephalic and atraumatic.  Eyes:     General: No scleral icterus.    Conjunctiva/sclera: Conjunctivae normal.  Neck:     Thyroid: No thyromegaly.  Cardiovascular:     Rate and Rhythm: Normal rate and regular rhythm.     Heart sounds: Murmur (II-III/VI, early to holosystolic murmur noted over left 2nd interspace) heard.  No friction rub. No gallop.   Pulmonary:     Effort: Pulmonary effort is normal. No respiratory distress.     Breath sounds: Normal breath sounds. No wheezing or rales.  Chest:     Breasts:        Right: No inverted nipple, mass, nipple discharge, skin change or tenderness.        Left: No inverted nipple, mass, nipple discharge, skin change or tenderness.  Abdominal:     General: Bowel sounds are normal. There is no distension.     Palpations: Abdomen is soft. There is no mass.     Tenderness: There is no abdominal tenderness. There is no guarding or rebound.  Musculoskeletal:        General: No swelling or tenderness. Normal range of motion.     Cervical back: Normal range of motion and neck supple.  Lymphadenopathy:     Cervical: No cervical adenopathy.     Lower Body: No right inguinal adenopathy. No left inguinal adenopathy.  Neurological:     General: No focal deficit present.  Mental Status: She is alert and oriented to person, place, and time.     Cranial Nerves: No cranial nerve deficit.  Skin:    General: Skin is warm and dry.     Findings: No erythema or rash.  Psychiatric:        Mood and Affect: Mood normal.        Behavior: Behavior normal.        Judgment: Judgment normal.     Female chaperone present for pelvic  and breast  portions of the physical exam  Results: AUDIT Questionnaire (screen for alcoholism): 0 PHQ-9: 0  Assessment: 49 y.o. G0P0000 female here for routine gynecologic examination.  Plan: Problem List Items Addressed This Visit    None    Visit Diagnoses    Women's annual routine gynecological examination    -  Primary   Relevant Medications   medroxyPROGESTERone Acetate 150 MG/ML SUSY   Screening for depression       Screening for alcoholism       Encounter for surveillance of injectable contraceptive       Depo-Provera contraceptive status       Relevant Medications   medroxyPROGESTERone Acetate 150 MG/ML SUSY   Heart murmur          Screening: -- Blood pressure screen managed by PCP -- Colonoscopy - not due -- Mammogram - not due -- Weight screening: obese: discussed management options, including lifestyle, dietary, and exercise. -- Depression screening negative (PHQ-9) -- Nutrition: normal -- cholesterol screening: per PCP -- osteoporosis screening: not due -- tobacco screening: using: discussed quitting using the 5 A's -- alcohol screening: AUDIT questionnaire indicates low-risk usage. -- family history of breast cancer screening: done. not at high risk. -- no evidence of domestic violence or intimate partner violence. -- STD screening: gonorrhea/chlamydia NAAT not collected per patient request. -- pap smear not collected per ASCCP guidelines -- Received COVID19 vaccines  Heart Murmur:  Discussed with patient. I could not find it mentioned in prior exams from other providers. I asked her to contact her PCP regarding any recommended workup for this.  Prentice Docker, MD 11/20/2019 9:32 AM

## 2019-12-18 ENCOUNTER — Other Ambulatory Visit: Payer: Managed Care, Other (non HMO)

## 2019-12-28 ENCOUNTER — Other Ambulatory Visit: Payer: Managed Care, Other (non HMO)

## 2019-12-29 ENCOUNTER — Ambulatory Visit (INDEPENDENT_AMBULATORY_CARE_PROVIDER_SITE_OTHER): Payer: Managed Care, Other (non HMO)

## 2019-12-29 ENCOUNTER — Other Ambulatory Visit: Payer: Self-pay

## 2019-12-29 DIAGNOSIS — Z3042 Encounter for surveillance of injectable contraceptive: Secondary | ICD-10-CM | POA: Diagnosis not present

## 2019-12-29 MED ORDER — MEDROXYPROGESTERONE ACETATE 150 MG/ML IM SUSP
150.0000 mg | Freq: Once | INTRAMUSCULAR | Status: AC
  Administered 2019-12-29: 150 mg via INTRAMUSCULAR

## 2019-12-29 NOTE — Progress Notes (Signed)
Pt here for depo which was given IM right glut.  NDC# [address]-2 

## 2019-12-30 ENCOUNTER — Ambulatory Visit: Admit: 2019-12-30 | Payer: Managed Care, Other (non HMO) | Admitting: Orthopedic Surgery

## 2019-12-30 SURGERY — ARTHROPLASTY, KNEE, TOTAL
Anesthesia: Spinal | Site: Knee | Laterality: Right

## 2020-02-29 ENCOUNTER — Telehealth: Payer: Self-pay | Admitting: Family Medicine

## 2020-02-29 ENCOUNTER — Encounter: Payer: Self-pay | Admitting: Physician Assistant

## 2020-02-29 ENCOUNTER — Telehealth (INDEPENDENT_AMBULATORY_CARE_PROVIDER_SITE_OTHER): Payer: Managed Care, Other (non HMO) | Admitting: Physician Assistant

## 2020-02-29 DIAGNOSIS — J189 Pneumonia, unspecified organism: Secondary | ICD-10-CM | POA: Diagnosis not present

## 2020-02-29 MED ORDER — LEVOFLOXACIN 500 MG PO TABS: 500.0000 mg | ORAL_TABLET | Freq: Every day | ORAL | 0 refills | Status: DC

## 2020-02-29 MED ORDER — FLUTICASONE FUROATE-VILANTEROL 200-25 MCG/INH IN AEPB: 1.0000 | INHALATION_SPRAY | Freq: Every day | RESPIRATORY_TRACT | 0 refills | Status: DC

## 2020-02-29 MED ORDER — PROMETHAZINE-DM 6.25-15 MG/5ML PO SYRP: 5.0000 mL | ORAL_SOLUTION | Freq: Four times a day (QID) | ORAL | 0 refills | Status: DC | PRN

## 2020-02-29 NOTE — Telephone Encounter (Signed)
Patient advised.

## 2020-02-29 NOTE — Patient Instructions (Signed)
Community-Acquired Pneumonia, Adult Pneumonia is an infection of the lungs. It causes swelling in the airways of the lungs. Mucus and fluid may also build up inside the airways. One type of pneumonia can happen while a person is in a hospital. A different type can happen when a person is not in a hospital (community-acquired pneumonia).  What are the causes?  This condition is caused by germs (viruses, bacteria, or fungi). Some types of germs can be passed from one person to another. This can happen when you breathe in droplets from the cough or sneeze of an infected person. What increases the risk? You are more likely to develop this condition if you:  Have a long-term (chronic) disease, such as: ? Chronic obstructive pulmonary disease (COPD). ? Asthma. ? Cystic fibrosis. ? Congestive heart failure. ? Diabetes. ? Kidney disease.  Have HIV.  Have sickle cell disease.  Have had your spleen removed.  Do not take good care of your teeth and mouth (poor dental hygiene).  Have a medical condition that increases the risk of breathing in droplets from your own mouth and nose.  Have a weakened body defense system (immune system).  Are a smoker.  Travel to areas where the germs that cause this illness are common.  Are around certain animals or the places they live. What are the signs or symptoms?  A dry cough.  A wet (productive) cough.  Fever.  Sweating.  Chest pain. This often happens when breathing deeply or coughing.  Fast breathing or trouble breathing.  Shortness of breath.  Shaking chills.  Feeling tired (fatigue).  Muscle aches. How is this treated? Treatment for this condition depends on many things. Most adults can be treated at home. In some cases, treatment must happen in a hospital. Treatment may include:  Medicines given by mouth or through an IV tube.  Being given extra oxygen.  Respiratory therapy. In rare cases, treatment for very bad pneumonia  may include:  Using a machine to help you breathe.  Having a procedure to remove fluid from around your lungs. Follow these instructions at home: Medicines  Take over-the-counter and prescription medicines only as told by your doctor. ? Only take cough medicine if you are losing sleep.  If you were prescribed an antibiotic medicine, take it as told by your doctor. Do not stop taking the antibiotic even if you start to feel better. General instructions   Sleep with your head and neck raised (elevated). You can do this by sleeping in a recliner or by putting a few pillows under your head.  Rest as needed. Get at least 8 hours of sleep each night.  Drink enough water to keep your pee (urine) pale yellow.  Eat a healthy diet that includes plenty of vegetables, fruits, whole grains, low-fat dairy products, and lean protein.  Do not use any products that contain nicotine or tobacco. These include cigarettes, e-cigarettes, and chewing tobacco. If you need help quitting, ask your doctor.  Keep all follow-up visits as told by your doctor. This is important. How is this prevented? A shot (vaccine) can help prevent pneumonia. Shots are often suggested for:  People older than 47 years of age.  People older than 47 years of age who: ? Are having cancer treatment. ? Have long-term (chronic) lung disease. ? Have problems with their body's defense system. You may also prevent pneumonia if you take these actions:  Get the flu (influenza) shot every year.  Go to the dentist as   often as told.  Wash your hands often. If you cannot use soap and water, use hand sanitizer. Contact a doctor if:  You have a fever.  You lose sleep because your cough medicine does not help. Get help right away if:  You are short of breath and it gets worse.  You have more chest pain.  Your sickness gets worse. This is very serious if: ? You are an older adult. ? Your body's defense system is weak.  You  cough up blood. Summary  Pneumonia is an infection of the lungs.  Most adults can be treated at home. Some will need treatment in a hospital.  Drink enough water to keep your pee pale yellow.  Get at least 8 hours of sleep each night. This information is not intended to replace advice given to you by your health care provider. Make sure you discuss any questions you have with your health care provider. Document Revised: 07/02/2018 Document Reviewed: 11/07/2017 Elsevier Patient Education  2020 Elsevier Inc.  

## 2020-02-29 NOTE — Progress Notes (Signed)
MyChart Video Visit    Virtual Visit via Video Note   This visit type was conducted due to national recommendations for restrictions regarding the COVID-19 Pandemic (e.g. social distancing) in an effort to limit this patient's exposure and mitigate transmission in our community. This patient is at least at moderate risk for complications without adequate follow up. This format is felt to be most appropriate for this patient at this time. Physical exam was limited by quality of the video and audio technology used for the visit.   Patient location: Home Provider location: [facility]  Interactive audio and video communications were attempted, although failed due to patient's inability to connect to video. Continued visit with audio only interaction with patient agreement.  I discussed the limitations of evaluation and management by telemedicine and the availability of in person appointments. The patient expressed understanding and agreed to proceed.  Patient: Kendra Anderson   DOB: 22-Jan-1971   49 y.o. Female  MRN: [id number] Visit Date: 02/29/2020  Today's healthcare provider: Mar Daring, PA-C   No chief complaint on file.  Subjective    HPI  Kendra Anderson is a 49 yr old female that presents today for worsening cough and congestion. She was seen at the [facility] and was diagnosed with Bronchitis. Covid 19 testing was negative. Flu testing was negative. She was given a Zpak, prednisone, Tussionex cough syrup and tessalon perles. She has had no improvement in symptoms. Nothing is helping her cough. She reports her roommate told her she coughed for 2 hours straight last night, overnight. Symptoms had started 8 days prior to UC visit.  Patient Active Problem List   Diagnosis Date Noted  . Primary osteoarthritis of right knee 10/05/2017  . Hypothyroidism (acquired)   . Chronic kidney disease, stage 3, mod decreased GFR ([facility]) 12/17/2014  . DM (diabetes mellitus), type 2 with renal  complications ([place]) 99/37/1696  . History of kidney stones 01/26/2014  . History of migraine 01/26/2014  . Hyperlipidemia 01/26/2014  . Hypertension 01/26/2014  . Hypothyroidism 01/26/2014  . Microalbuminuria 01/26/2014  . Morbid obesity ([place]) 01/26/2014  . Tobacco dependence 01/26/2014   Past Medical History:  Diagnosis Date  . Chronic kidney disease   . Depression    mild, no meds  . Diabetes mellitus with nephropathy ([place])   . Family history of breast cancer 10/2016   BRCA genetic testing letter sent  . GERD (gastroesophageal reflux disease)   . Hyperlipidemia   . Hypertension   . Hypothyroidism (acquired)   . IBS (irritable bowel syndrome)   . Morbid obesity ([place])   . Tobacco abuse       Medications: Outpatient Medications Prior to Visit  Medication Sig  . amLODipine (NORVASC) 10 MG tablet Take 10 mg by mouth daily.   Marland Kitchen amoxicillin (AMOXIL) 875 MG tablet amoxicillin 875 mg tablet  . atorvastatin (LIPITOR) 40 MG tablet Take 40 mg by mouth daily.  . Continuous Blood Gluc Receiver (FREESTYLE LIBRE 14 DAY READER) DEVI Use 1 Device once daily (Patient not taking: Reported on 11/20/2019)  . Continuous Blood Gluc Sensor (FREESTYLE LIBRE 14 DAY SENSOR) MISC Use 1 kit every 14 (fourteen) days (Patient not taking: Reported on 11/20/2019)  . enalapril (VASOTEC) 10 MG tablet Take 10 mg by mouth daily.   Marland Kitchen glucose blood (ONE TOUCH ULTRA TEST) test strip USE 4 TIMES DAILY  . HUMALOG 100 UNIT/ML injection   . Insulin Human (INSULIN PUMP) SOLN Inject into the skin.  Marland Kitchen insulin lispro (  HUMALOG) 100 UNIT/ML injection Inject 100 Units into the skin continuous.  . Insulin Pen Needle (FIFTY50 PEN NEEDLES) 31G X 8 MM MISC USE ONCE DAILY WITH VICTOZA INJECTION  . LANCETS MICRO THIN 33G MISC Use 1 each 5 (five) times daily.  Marland Kitchen levothyroxine (SYNTHROID, LEVOTHROID) 112 MCG tablet Take 112 mcg by mouth daily before breakfast.   . medroxyPROGESTERone Acetate 150 MG/ML SUSY Inject 1 mL (150 mg  total) into the muscle every 3 (three) months.  . metFORMIN (GLUCOPHAGE) 1000 MG tablet Take 1,000 mg by mouth 2 (two) times daily with a meal.   . Multiple Vitamin (MULTI-VITAMINS) TABS Take 1 tablet by mouth daily.  . Semaglutide, 1 MG/DOSE, (OZEMPIC, 1 MG/DOSE,) 2 MG/1.5ML SOPN Ozempic 1 mg/dose (2 mg/1.5 mL) subcutaneous pen injector (Patient not taking: Reported on 11/20/2019)  . traMADol (ULTRAM) 50 MG tablet Take 50 mg by mouth every 6 (six) hours as needed.  Marland Kitchen VICTOZA 18 MG/3ML SOPN    No facility-administered medications prior to visit.    Review of Systems  Constitutional: Positive for fatigue.  HENT: Positive for congestion, postnasal drip and sore throat (from coughing). Negative for sneezing.   Respiratory: Positive for cough, chest tightness and wheezing. Negative for shortness of breath.   Cardiovascular: Negative for chest pain, palpitations and leg swelling.  Gastrointestinal: Positive for abdominal pain and nausea.  Neurological: Positive for headaches. Negative for dizziness.    Last CBC Lab Results  Component Value Date   WBC 11.0 07/20/2014   HGB 13.4 07/20/2014   HCT 41.0 07/20/2014   MCV 84 07/20/2014   MCH 27.4 07/20/2014   RDW 17.8 (H) 07/20/2014   PLT 262 28/00/3491   Last metabolic panel Lab Results  Component Value Date   GLUCOSE 179 (H) 07/20/2014   NA 141 07/20/2014   K 4.0 07/20/2014   CL 107 07/20/2014   CO2 26 07/20/2014   BUN 17 07/20/2014   CREATININE 1.15 (H) 07/20/2014   GFRNONAA 59 (L) 07/20/2014   GFRAA >60 07/20/2014   CALCIUM 9.2 07/20/2014   PROT 7.0 07/20/2014   ALBUMIN 3.4 (L) 07/20/2014   BILITOT 0.6 07/20/2014   ALKPHOS 130 (H) 07/20/2014   AST 18 07/20/2014   ALT 21 07/20/2014   ANIONGAP 8 07/20/2014      Objective    There were no vitals taken for this visit. BP Readings from Last 3 Encounters:  11/20/19 120/60  04/10/19 (!) 157/75  11/18/18 110/64   Wt Readings from Last 3 Encounters:  11/20/19 275 lb (124.7  kg)  04/10/19 273 lb 9.6 oz (124.1 kg)  11/18/18 269 lb 9.6 oz (122.3 kg)      Physical Exam     Assessment & Plan     1. Pneumonia due to infectious organism, unspecified laterality, unspecified part of lung Symptoms worsening despite multiple treatments noted in HPI. Will start Levaquin as below. Add Breo inhaler. Continue Albuterol inhaler. Complete prednisone (has 3 days left). Change Tussionex cough syrup to Promethazine DM as below. Push fluids. Rest as needed. Call if worsening. May require CXR if not improving.  - levofloxacin (LEVAQUIN) 500 MG tablet; Take 1 tablet (500 mg total) by mouth daily.  Dispense: 7 tablet; Refill: 0 - fluticasone furoate-vilanterol (BREO ELLIPTA) 200-25 MCG/INH AEPB; Inhale 1 puff into the lungs daily.  Dispense: 28 each; Refill: 0 - promethazine-dextromethorphan (PROMETHAZINE-DM) 6.25-15 MG/5ML syrup; Take 5 mLs by mouth 4 (four) times daily as needed for cough.  Dispense: 120 mL; Refill: 0  No follow-ups on file.     I discussed the assessment and treatment plan with the patient. The patient was provided an opportunity to ask questions and all were answered. The patient agreed with the plan and demonstrated an understanding of the instructions.   The patient was advised to call back or seek an in-person evaluation if the symptoms worsen or if the condition fails to improve as anticipated.  I provided 16 minutes of non-face-to-face time during this encounter.  Reynolds Bowl, PA-C, have reviewed all documentation for this visit. The documentation on 02/29/20 for the exam, diagnosis, procedures, and orders are all accurate and complete.  [place] [facility] [phone] (phone) [phone] (fax)  [place]

## 2020-02-29 NOTE — Telephone Encounter (Signed)
Patient called to schedule an appt for follow up for bronchitis. Patient went to walk in clinic on 02/23/20. There pt was dx with bronchitis. Patient does not feel like she is getting any better. Patient is requesting an in office appt for this afternoon. At this time a virtual appt has been scheduled. Please advise Cb- [phone]

## 2020-02-29 NOTE — Telephone Encounter (Signed)
We have not been seeing symptomatic inpatient to reduce exposure to well patients coming in the office for physicals, etc. It is preferred to be seen virtually.

## 2020-02-29 NOTE — Telephone Encounter (Signed)
Pt wanted to do an in office visit instead of mychart visit. Please review. Thanks!

## 2020-03-22 ENCOUNTER — Other Ambulatory Visit: Payer: Self-pay

## 2020-03-22 ENCOUNTER — Ambulatory Visit (INDEPENDENT_AMBULATORY_CARE_PROVIDER_SITE_OTHER): Payer: Managed Care, Other (non HMO)

## 2020-03-22 DIAGNOSIS — Z30019 Encounter for initial prescription of contraceptives, unspecified: Secondary | ICD-10-CM

## 2020-03-22 MED ORDER — MEDROXYPROGESTERONE ACETATE 150 MG/ML IM SUSP
150.0000 mg | Freq: Once | INTRAMUSCULAR | Status: AC
  Administered 2020-03-22: 09:00:00 150 mg via INTRAMUSCULAR

## 2020-05-03 ENCOUNTER — Other Ambulatory Visit: Payer: Self-pay

## 2020-05-03 ENCOUNTER — Ambulatory Visit: Payer: Managed Care, Other (non HMO) | Admitting: Family Medicine

## 2020-05-03 VITALS — BP 113/65 | HR 97 | Temp 98.4°F | Ht 61.0 in | Wt 280.4 lb

## 2020-05-03 DIAGNOSIS — L732 Hidradenitis suppurativa: Secondary | ICD-10-CM

## 2020-05-03 MED ORDER — SULFAMETHOXAZOLE-TRIMETHOPRIM 800-160 MG PO TABS: 2.0000 | ORAL_TABLET | Freq: Two times a day (BID) | ORAL | 0 refills | Status: AC

## 2020-05-03 NOTE — Progress Notes (Signed)
Established patient visit   Patient: Kendra Anderson   DOB: 1970/06/01   50 y.o. Female  MRN: [id number] Visit Date: 05/03/2020  Today's healthcare provider: Lelon Huh, MD   Chief Complaint  Patient presents with  . Abrasion    Abscess superior to genital area   Subjective    HPI HPI    Abrasion     Additional comments: Abscess superior to genital area       Last edited by Sylvester Harder, [place] on 05/03/2020  3:11 PM. (History)      Abscess: Patient presents for evaluation of a cutaneous abscess. Lesion is located in the superior to genital area. Onset was 4 days ago. Symptoms have gradually worsened. Abscess has associated symptoms of pain, especially when sitting and wiping. Patient does have previous history of cutaneous abscesses. Patient does have diabetes.       Medications: Outpatient Medications Prior to Visit  Medication Sig  . amLODipine (NORVASC) 10 MG tablet Take 10 mg by mouth daily.   Marland Kitchen atorvastatin (LIPITOR) 40 MG tablet Take 40 mg by mouth daily.  . enalapril (VASOTEC) 10 MG tablet Take 10 mg by mouth daily.   Marland Kitchen glucose blood test strip USE 4 TIMES DAILY  . HUMALOG 100 UNIT/ML injection   . Insulin Human (INSULIN PUMP) SOLN Inject into the skin.  Marland Kitchen insulin lispro (HUMALOG) 100 UNIT/ML injection Inject 100 Units into the skin continuous.  . Insulin Pen Needle 31G X 8 MM MISC USE ONCE DAILY WITH VICTOZA INJECTION  . LANCETS MICRO THIN 33G MISC Use 1 each 5 (five) times daily.  Marland Kitchen levothyroxine (SYNTHROID, LEVOTHROID) 112 MCG tablet Take 112 mcg by mouth daily before breakfast.   . medroxyPROGESTERone Acetate 150 MG/ML SUSY Inject 1 mL (150 mg total) into the muscle every 3 (three) months.  . metFORMIN (GLUCOPHAGE) 1000 MG tablet Take 1,000 mg by mouth 2 (two) times daily with a meal.   . Multiple Vitamin (MULTI-VITAMINS) TABS Take 1 tablet by mouth daily.  . Semaglutide, 1 MG/DOSE, (OZEMPIC, 1 MG/DOSE,) 2 MG/1.5ML SOPN Ozempic 1 mg/dose (2 mg/1.5  mL) subcutaneous pen injector  . traMADol (ULTRAM) 50 MG tablet Take 50 mg by mouth every 6 (six) hours as needed.  . [DISCONTINUED] amoxicillin (AMOXIL) 875 MG tablet amoxicillin 875 mg tablet  . [DISCONTINUED] Continuous Blood Gluc Receiver (FREESTYLE LIBRE 14 DAY READER) DEVI Use 1 Device once daily (Patient not taking: No sig reported)  . [DISCONTINUED] Continuous Blood Gluc Sensor (FREESTYLE LIBRE 14 DAY SENSOR) MISC Use 1 kit every 14 (fourteen) days (Patient not taking: No sig reported)  . [DISCONTINUED] fluticasone furoate-vilanterol (BREO ELLIPTA) 200-25 MCG/INH AEPB Inhale 1 puff into the lungs daily. (Patient not taking: Reported on 05/03/2020)  . [DISCONTINUED] levofloxacin (LEVAQUIN) 500 MG tablet Take 1 tablet (500 mg total) by mouth daily.  . [DISCONTINUED] promethazine-dextromethorphan (PROMETHAZINE-DM) 6.25-15 MG/5ML syrup Take 5 mLs by mouth 4 (four) times daily as needed for cough.  . [DISCONTINUED] [place] 72 MG/3ML SOPN  (Patient not taking: Reported on 05/03/2020)   No facility-administered medications prior to visit.    Review of Systems  Skin: Positive for color change. Negative for pallor, rash and wound.       Objective    BP 113/65 (BP Location: Right Arm, Patient Position: Sitting, Cuff Size: Large)   Pulse 97   Temp 98.4 F (36.9 C) (Temporal)   Ht _0  (1.549 m)   Wt 280 lb 6.4 oz (127.2 kg)  SpO2 97%   BMI 52.98 kg/m     Physical Exam   Firm tender pea size subcutaneous mass with overlying erythema over right mons pubis. No discharge.     Assessment & Plan     1. Hidradenitis suppurativa  - sulfamethoxazole-trimethoprim (BACTRIM DS) 800-160 MG tablet; Take 2 tablets by mouth 2 (two) times daily for 10 days.  Dispense: 40 tablet; Refill: 0   Recommend sit in warm sitz bath and apply warm compresses. Call if symptoms change or if not rapidly improving.    She declined flu vaccine stating she had one in October of 2021.       The entirety of  the information documented in the History of Present Illness, Review of Systems and Physical Exam were personally obtained by me. Portions of this information were initially documented by the CMA and reviewed by me for thoroughness and accuracy.      Lelon Huh, MD  [facility] [phone] (phone) [phone] (fax)  [place]

## 2020-06-14 ENCOUNTER — Ambulatory Visit (INDEPENDENT_AMBULATORY_CARE_PROVIDER_SITE_OTHER): Payer: Managed Care, Other (non HMO)

## 2020-06-14 ENCOUNTER — Other Ambulatory Visit: Payer: Self-pay

## 2020-06-14 DIAGNOSIS — Z3042 Encounter for surveillance of injectable contraceptive: Secondary | ICD-10-CM | POA: Diagnosis not present

## 2020-06-14 MED ORDER — MEDROXYPROGESTERONE ACETATE 150 MG/ML IM SUSP
150.0000 mg | Freq: Once | INTRAMUSCULAR | Status: AC
  Administered 2020-06-14: 150 mg via INTRAMUSCULAR

## 2020-06-14 NOTE — Progress Notes (Signed)
Patient presents today for Depo Provera injection within dates. Given IM RUOQ. Patient tolerated well. 

## 2020-07-13 ENCOUNTER — Ambulatory Visit: Payer: Self-pay | Admitting: *Deleted

## 2020-07-13 ENCOUNTER — Encounter: Payer: Self-pay | Admitting: Family Medicine

## 2020-07-13 DIAGNOSIS — L732 Hidradenitis suppurativa: Secondary | ICD-10-CM

## 2020-07-13 MED ORDER — AMOXICILLIN-POT CLAVULANATE 875-125 MG PO TABS: 1.0000 | ORAL_TABLET | Freq: Two times a day (BID) | ORAL | 2 refills | Status: AC

## 2020-07-13 NOTE — Addendum Note (Signed)
Addended by: Malva Limes on: 07/13/2020 12:56 PM   Modules accepted: Orders

## 2020-07-13 NOTE — Telephone Encounter (Signed)
Patient advised.

## 2020-07-13 NOTE — Telephone Encounter (Signed)
Patient has dime-sized boil at Left armpit, another at the hairline just above the vagina-half dollar size, red and sore to touch. No drainage and no head on either.  Low grade fever around 99.5 oral intermittently for 3-4 days. Denies all other sxs. Stated she gets these boils occasionally and Dr. Sherrie Mustache has prescribed antibiotic to apply.  No appointment openings found. She does not want to go to UC. Requesting if an antibiotic can be called in to her pharmacy on file. Continue warm sitz baths for now. I reviewed signs of infection, stated she understood.   [facility]    Reason for Disposition . 2 or more boils  Answer Assessment - Initial Assessment Questions 1. APPEARANCE of BOIL: "What does the boil look like?"      Round and red 2. LOCATION: "Where is the boil located?"      Armpit of left arm and in the hairline above vagina 3. NUMBER: "How many boils are there?"      One in each spot 4. SIZE: "How big is the boil?" (e.g., inches, cm; compare to size of a coin or other object)     Underarm-dime sized and other is half-dollar sized 5. ONSET: "When did the boil start?"     About 4 days ago 6. PAIN: "Is there any pain?" If Yes, ask: "How bad is the pain?"   (Scale 1-10; or mild, moderate, severe)     Mild to moderate 7. FEVER: "Do you have a fever?" If Yes, ask: "What is it, how was it measured, and when did it start?"      99.5 oral on/off over last 4 days. 8. SOURCE: "Have you been around anyone with boils or other Staph infections?" "Have you ever had boils before?"     She has these type boils occasionally 9. OTHER SYMPTOMS: "Do you have any other symptoms?" (e.g., shaking chills, weakness, rash elsewhere on body)     No other sxs 10. PREGNANCY: "Is there any chance you are pregnant?" "When was your last menstrual period?"       na  Protocols used: BOIL (SKIN ABSCESS)-A-AH

## 2020-07-13 NOTE — Telephone Encounter (Signed)
Have sent prescription augmenting to [facility].

## 2020-09-06 ENCOUNTER — Ambulatory Visit (INDEPENDENT_AMBULATORY_CARE_PROVIDER_SITE_OTHER): Payer: Managed Care, Other (non HMO)

## 2020-09-06 ENCOUNTER — Other Ambulatory Visit: Payer: Self-pay

## 2020-09-06 DIAGNOSIS — Z3042 Encounter for surveillance of injectable contraceptive: Secondary | ICD-10-CM

## 2020-09-06 MED ORDER — MEDROXYPROGESTERONE ACETATE 150 MG/ML IM SUSP
150.0000 mg | Freq: Once | INTRAMUSCULAR | Status: AC
  Administered 2020-09-06: 150 mg via INTRAMUSCULAR

## 2020-09-06 NOTE — Progress Notes (Signed)
Pt here for depo inj which was given IM right glut.  NDC# [phone]

## 2020-11-29 ENCOUNTER — Ambulatory Visit: Payer: Managed Care, Other (non HMO)

## 2020-11-29 ENCOUNTER — Other Ambulatory Visit: Payer: Self-pay | Admitting: Obstetrics and Gynecology

## 2020-11-29 DIAGNOSIS — Z01419 Encounter for gynecological examination (general) (routine) without abnormal findings: Secondary | ICD-10-CM

## 2020-11-29 DIAGNOSIS — Z3042 Encounter for surveillance of injectable contraceptive: Secondary | ICD-10-CM

## 2020-12-02 NOTE — Telephone Encounter (Signed)
Patient scheduled AE 9/28/522 w/SDJ. Needs rf of Depo Provera for her apt on Tuesday 12/06/20 This encounter was created in error - please disregard.

## 2020-12-02 NOTE — Telephone Encounter (Signed)
Pt scheduled AE 12/21/20 w/SDJ. 1 RF Sent

## 2020-12-06 ENCOUNTER — Ambulatory Visit (INDEPENDENT_AMBULATORY_CARE_PROVIDER_SITE_OTHER): Payer: Managed Care, Other (non HMO)

## 2020-12-06 ENCOUNTER — Other Ambulatory Visit: Payer: Self-pay

## 2020-12-06 ENCOUNTER — Encounter: Payer: Self-pay | Admitting: Internal Medicine

## 2020-12-06 DIAGNOSIS — Z3042 Encounter for surveillance of injectable contraceptive: Secondary | ICD-10-CM

## 2020-12-06 MED ORDER — MEDROXYPROGESTERONE ACETATE 150 MG/ML IM SUSP
150.0000 mg | Freq: Once | INTRAMUSCULAR | Status: AC
  Administered 2020-12-06: 150 mg via INTRAMUSCULAR

## 2020-12-06 NOTE — Progress Notes (Signed)
Pt here for depo inj which was given IM left dorsoglut.  NDC# [id number]

## 2020-12-07 ENCOUNTER — Ambulatory Visit: Payer: Managed Care, Other (non HMO) | Admitting: Anesthesiology

## 2020-12-07 ENCOUNTER — Encounter: Payer: Self-pay | Admitting: Internal Medicine

## 2020-12-07 ENCOUNTER — Ambulatory Visit
Admission: RE | Admit: 2020-12-07 | Discharge: 2020-12-07 | Disposition: A | Discharge status: Home / Self Care | Payer: Managed Care, Other (non HMO) | Attending: Internal Medicine | Admitting: Internal Medicine

## 2020-12-07 ENCOUNTER — Encounter
Admission: RE | Disposition: A | Discharge status: Home / Self Care | Payer: Self-pay | Source: Home / Self Care | Attending: Internal Medicine

## 2020-12-07 DIAGNOSIS — Z794 Long term (current) use of insulin: Secondary | ICD-10-CM | POA: Diagnosis not present

## 2020-12-07 DIAGNOSIS — K591 Functional diarrhea: Secondary | ICD-10-CM | POA: Insufficient documentation

## 2020-12-07 DIAGNOSIS — F17200 Nicotine dependence, unspecified, uncomplicated: Secondary | ICD-10-CM | POA: Diagnosis not present

## 2020-12-07 DIAGNOSIS — Z888 Allergy status to other drugs, medicaments and biological substances status: Secondary | ICD-10-CM | POA: Diagnosis not present

## 2020-12-07 DIAGNOSIS — K6389 Other specified diseases of intestine: Secondary | ICD-10-CM | POA: Diagnosis not present

## 2020-12-07 DIAGNOSIS — Z79899 Other long term (current) drug therapy: Secondary | ICD-10-CM | POA: Diagnosis not present

## 2020-12-07 DIAGNOSIS — K640 First degree hemorrhoids: Secondary | ICD-10-CM | POA: Diagnosis not present

## 2020-12-07 DIAGNOSIS — Z79890 Hormone replacement therapy (postmenopausal): Secondary | ICD-10-CM | POA: Insufficient documentation

## 2020-12-07 DIAGNOSIS — E119 Type 2 diabetes mellitus without complications: Secondary | ICD-10-CM | POA: Diagnosis not present

## 2020-12-07 DIAGNOSIS — Z7984 Long term (current) use of oral hypoglycemic drugs: Secondary | ICD-10-CM | POA: Insufficient documentation

## 2020-12-07 HISTORY — PX: COLONOSCOPY WITH PROPOFOL: SHX5780

## 2020-12-07 HISTORY — DX: Anxiety disorder, unspecified: F41.9

## 2020-12-07 LAB — GLUCOSE, CAPILLARY: Glucose-Capillary: 297 mg/dL — ABNORMAL HIGH (ref 70–99)

## 2020-12-07 SURGERY — COLONOSCOPY WITH PROPOFOL
Anesthesia: General

## 2020-12-07 MED ORDER — MIDAZOLAM HCL 2 MG/2ML IJ SOLN
INTRAMUSCULAR | Status: DC | PRN
  Administered 2020-12-07: 2 mg via INTRAVENOUS

## 2020-12-07 MED ORDER — PROPOFOL 10 MG/ML IV BOLUS
INTRAVENOUS | Status: DC | PRN
  Administered 2020-12-07: 30 mg via INTRAVENOUS

## 2020-12-07 MED ORDER — PROPOFOL 500 MG/50ML IV EMUL
INTRAVENOUS | Status: DC | PRN
  Administered 2020-12-07: 75 ug/kg/min via INTRAVENOUS

## 2020-12-07 MED ORDER — FENTANYL CITRATE (PF) 100 MCG/2ML IJ SOLN
INTRAMUSCULAR | Status: DC | PRN
  Administered 2020-12-07: 100 ug via INTRAVENOUS

## 2020-12-07 MED ORDER — ONDANSETRON HCL 4 MG/2ML IJ SOLN
INTRAMUSCULAR | Status: DC | PRN
Start: 2020-12-07 — End: 2020-12-07
  Administered 2020-12-07: 4 mg via INTRAVENOUS

## 2020-12-07 MED ORDER — PHENYLEPHRINE HCL (PRESSORS) 10 MG/ML IV SOLN
INTRAVENOUS | Status: AC
  Filled 2020-12-07: qty 1

## 2020-12-07 MED ORDER — MIDAZOLAM HCL 2 MG/2ML IJ SOLN
INTRAMUSCULAR | Status: AC
  Filled 2020-12-07: qty 2

## 2020-12-07 MED ORDER — FENTANYL CITRATE (PF) 100 MCG/2ML IJ SOLN
INTRAMUSCULAR | Status: AC
  Filled 2020-12-07: qty 2

## 2020-12-07 MED ORDER — PHENYLEPHRINE HCL (PRESSORS) 10 MG/ML IV SOLN
INTRAVENOUS | Status: DC | PRN
  Administered 2020-12-07: 100 ug via INTRAVENOUS

## 2020-12-07 MED ORDER — SODIUM CHLORIDE 0.9 % IV SOLN
INTRAVENOUS | Status: DC
  Administered 2020-12-07: 20 mL/h via INTRAVENOUS

## 2020-12-07 MED ORDER — PROPOFOL 500 MG/50ML IV EMUL
INTRAVENOUS | Status: AC
  Filled 2020-12-07: qty 50

## 2020-12-07 NOTE — H&P (Signed)
Outpatient short stay form Pre-procedure 12/07/2020 8:21 AM Kendra Anderson K. Kendra Anderson, M.D.  Primary Physician: Lelon Huh, M.D.  Reason for visit:  Functional diarrhea, HX of colitis  History of present illness:  50 y/o female c/o many years of diarrhea, uncontrolled DM and perceived history of "colitis" (?type) diagnosed several years ago. Previous Colonoscopy by Dr. Vira Agar in 2011, suggested it as being nonspecific, but in 06/2017 the biopsies were negative for colitis.    Current Facility-Administered Medications:    0.9 %  sodium chloride infusion, , Intravenous, Continuous, Wilson, Benay Pike, MD, Last Rate: 20 mL/hr at 12/07/20 0728, 20 mL/hr at 12/07/20 7564  Medications Prior to Admission  Medication Sig Dispense Refill Last Dose   amLODipine (NORVASC) 10 MG tablet Take 10 mg by mouth daily.    12/07/2020 at 0530   atorvastatin (LIPITOR) 40 MG tablet Take 40 mg by mouth daily.   Past Week   colestipol (COLESTID) 1 g tablet Take 1 g by mouth 2 (two) times daily.   12/06/2020   enalapril (VASOTEC) 10 MG tablet Take 10 mg by mouth daily.    12/07/2020 at 0530   glucose blood test strip USE 4 TIMES DAILY   12/06/2020   HUMALOG 100 UNIT/ML injection    Past Week   Insulin Human (INSULIN PUMP) SOLN Inject into the skin.   Past Week   insulin lispro (HUMALOG) 100 UNIT/ML injection Inject 100 Units into the skin continuous.   Past Week   Insulin Pen Needle 31G X 8 MM MISC USE ONCE DAILY WITH VICTOZA INJECTION   Past Week   LANCETS MICRO THIN 33G MISC Use 1 each 5 (five) times daily.   12/06/2020   levothyroxine (SYNTHROID, LEVOTHROID) 112 MCG tablet Take 112 mcg by mouth daily before breakfast.    Past Week   medroxyPROGESTERone Acetate 150 MG/ML SUSY ADMINISTER 1 ML(150 MG) IN THE MUSCLE EVERY 3 MONTHS 1 mL 0 Past Week   metFORMIN (GLUCOPHAGE) 1000 MG tablet Take 1,000 mg by mouth 2 (two) times daily with a meal.    Past Week   Multiple Vitamin (MULTI-VITAMINS) TABS Take 1 tablet by mouth daily.    Past Week   Semaglutide, 1 MG/DOSE, (OZEMPIC, 1 MG/DOSE,) 2 MG/1.5ML SOPN Ozempic 1 mg/dose (2 mg/1.5 mL) subcutaneous pen injector   Past Week   traMADol (ULTRAM) 50 MG tablet Take 50 mg by mouth every 6 (six) hours as needed.   Past Week     Allergies  Allergen Reactions   Isometheptene-Dichloral-Apap Hives    MIDRIN     Past Medical History:  Diagnosis Date   Anxiety    Chronic kidney disease    Depression    mild, no meds   Diabetes mellitus with nephropathy ([place])    Family history of breast cancer 10/2016   BRCA genetic testing letter sent   GERD (gastroesophageal reflux disease)    Hyperlipidemia    Hypertension    Hypothyroidism (acquired)    IBS (irritable bowel syndrome)    Morbid obesity ([place])    Tobacco abuse     Review of systems:  Otherwise negative.    Physical Exam  Gen: Alert, oriented. Appears stated age.  HEENT: [place]/AT. PERRLA. Lungs: CTA, no wheezes. CV: RR nl S1, S2. Abd: soft, benign, no masses. BS+ Ext: No edema. Pulses 2+    Planned procedures: Proceed with colonoscopy. The patient understands the nature of the planned procedure, indications, risks, alternatives and potential complications including but not limited to bleeding,  infection, perforation, damage to internal organs and possible oversedation/side effects from anesthesia. The patient agrees and gives consent to proceed.  Please refer to procedure notes for findings, recommendations and patient disposition/instructions.     Adeyemi Hamad K. Kendra Anderson, M.D. Gastroenterology 12/07/2020  8:21 AM

## 2020-12-07 NOTE — Transfer of Care (Signed)
Immediate Anesthesia Transfer of Care Note  Patient: Kendra Anderson  Procedure(s) Performed: COLONOSCOPY WITH PROPOFOL  Patient Location: PACU  Anesthesia Type:General  Level of Consciousness: awake, alert  and oriented  Airway & Oxygen Therapy: Patient Spontanous Breathing  Post-op Assessment: Report given to RN, Post -op Vital signs reviewed and stable and Patient moving all extremities  Post vital signs: Reviewed and stable  Last Vitals:  Vitals Value Taken Time  BP 98/66 12/07/20 0850  Temp    Pulse 87 12/07/20 0850  Resp 19 12/07/20 0850  SpO2 98 % 12/07/20 0850  Vitals shown include unvalidated device data.  Last Pain:  Vitals:   12/07/20 0715  TempSrc: Temporal  PainSc: 0-No pain         Complications: No notable events documented.

## 2020-12-07 NOTE — Interval H&P Note (Signed)
History and Physical Interval Note:  12/07/2020 8:23 AM  Kendra Anderson  has presented today for surgery, with the diagnosis of CHRONIC DIARRHEA HX COLITIS.  The various methods of treatment have been discussed with the patient and family. After consideration of risks, benefits and other options for treatment, the patient has consented to  Procedure(s) with comments: COLONOSCOPY WITH PROPOFOL (N/A) - IDDM as a surgical intervention.  The patient's history has been reviewed, patient examined, no change in status, stable for surgery.  I have reviewed the patient's chart and labs.  Questions were answered to the patient's satisfaction.     [place], [place]

## 2020-12-07 NOTE — Op Note (Signed)
[facility] Gastroenterology Patient Name: Kendra Anderson Procedure Date: 12/07/2020 7:49 AM MRN: [id number] Account #: [id number] Date of Birth: 31-May-1970 Admit Type: Outpatient Age: 50 Room: [facility] ENDO ROOM 2 Gender: Female Note Status: Finalized Instrument Name: Prentice Docker [id number] Procedure:             Colonoscopy Indications:           Functional diarrhea, Hx colitis Providers:             Boykin Nearing. Norma Fredrickson MD, MD Referring MD:          Demetrios Isaacs. Sherrie Mustache, MD (Referring MD) Medicines:             Propofol per Anesthesia Complications:         No immediate complications. Procedure:             Pre-Anesthesia Assessment:                        - The risks and benefits of the procedure and the                         sedation options and risks were discussed with the                         patient. All questions were answered and informed                         consent was obtained.                        - Patient identification and proposed procedure were                         verified prior to the procedure by the nurse. The                         procedure was verified in the procedure room.                        - ASA Grade Assessment: III - A patient with severe                         systemic disease.                        - After reviewing the risks and benefits, the patient                         was deemed in satisfactory condition to undergo the                         procedure.                        After obtaining informed consent, the colonoscope was                         passed under direct vision. Throughout the procedure,                         the  patient's blood pressure, pulse, and oxygen                         saturations were monitored continuously. The                         Colonoscope was introduced through the anus and                         advanced to the the terminal ileum, with                          identification of the appendiceal orifice and IC                         valve. The colonoscopy was performed without                         difficulty. The patient tolerated the procedure well.                         The quality of the bowel preparation was good. The                         terminal ileum, ileocecal valve, appendiceal orifice,                         and rectum were photographed. Findings:      The perianal and digital rectal examinations were normal. Pertinent       negatives include normal sphincter tone and no palpable rectal lesions.      Non-bleeding internal hemorrhoids were found during retroflexion. The       hemorrhoids were Grade I (internal hemorrhoids that do not prolapse).      The terminal ileum appeared normal. Biopsies were taken with a cold       forceps for histology.      Normal mucosa was found in the entire colon. Biopsies for histology were       taken with a cold forceps from the random colon for evaluation of       microscopic colitis.      The exam was otherwise without abnormality. Impression:            - Non-bleeding internal hemorrhoids.                        - The examined portion of the ileum was normal.                         Biopsied.                        - Normal mucosa in the entire examined colon. Biopsied.                        - The examination was otherwise normal. Recommendation:        - Patient has a contact number available for                         emergencies. The signs and symptoms of  potential                         delayed complications were discussed with the patient.                         Return to normal activities tomorrow. Written                         discharge instructions were provided to the patient.                        - Resume previous diet.                        - Continue present medications.                        - Await pathology results.                        - Repeat colonoscopy in 10  years for screening                         purposes.                        - Return to physician assistant in 3 months.                        - The findings and recommendations were discussed with                         the patient. Procedure Code(s):     --- Professional ---                        [phone], Colonoscopy, flexible; with biopsy, single or                         multiple Diagnosis Code(s):     --- Professional ---                        K59.1, Functional diarrhea                        K64.0, First degree hemorrhoids CPT copyright 2019 American Medical Association. All rights reserved. The codes documented in this report are preliminary and upon coder review may  be revised to meet current compliance requirements. Stanton Kidney MD, MD 12/07/2020 8:45:09 AM This report has been signed electronically. Number of Addenda: 0 Note Initiated On: 12/07/2020 7:49 AM Scope Withdrawal Time: 0 hours 6 minutes 39 seconds  Total Procedure Duration: 0 hours 9 minutes 43 seconds  Estimated Blood Loss:  Estimated blood loss: none.      [facility]

## 2020-12-07 NOTE — Anesthesia Preprocedure Evaluation (Signed)
Anesthesia Evaluation  Patient identified by MRN, date of birth, ID band Patient awake    Reviewed: Allergy & Precautions, NPO status , Patient's Chart, lab work & pertinent test results  History of Anesthesia Complications Negative for: history of anesthetic complications  Airway Mallampati: III  TM Distance: <3 FB Neck ROM: full    Dental  (+) Chipped   Pulmonary neg shortness of breath, Current Smoker and Patient abstained from smoking.,    Pulmonary exam normal        Cardiovascular Exercise Tolerance: Good hypertension, (-) angina(-) Past MI and (-) DOE Normal cardiovascular exam     Neuro/Psych PSYCHIATRIC DISORDERS negative neurological ROS     GI/Hepatic Neg liver ROS, GERD  Medicated and Controlled,  Endo/Other  diabetes, Type 2Hypothyroidism   Renal/GU Renal disease  negative genitourinary   Musculoskeletal   Abdominal   Peds  Hematology negative hematology ROS (+)   Anesthesia Other Findings Past Medical History: No date: Anxiety No date: Chronic kidney disease No date: Depression     Comment:  mild, no meds No date: Diabetes mellitus with nephropathy ([place]) 10/2016: Family history of breast cancer     Comment:  BRCA genetic testing letter sent No date: GERD (gastroesophageal reflux disease) No date: Hyperlipidemia No date: Hypertension No date: Hypothyroidism (acquired) No date: IBS (irritable bowel syndrome) No date: Morbid obesity ([place]) No date: Tobacco abuse  Past Surgical History: 2005: CHOLECYSTECTOMY 2011: COLONOSCOPY     Comment:  Dr. Vira Agar 07/08/2017: COLONOSCOPY WITH PROPOFOL; N/A     Comment:  Procedure: COLONOSCOPY WITH PROPOFOL;  Surgeon: Manya Silvas, MD;  Location: [facility] ENDOSCOPY;  Service:               Endoscopy;  Laterality: N/A; 2012: kidney stones removed  BMI    Body Mass Index: 51.96 kg/m      Reproductive/Obstetrics negative OB ROS                              Anesthesia Physical Anesthesia Plan  ASA: 3  Anesthesia Plan: General   Post-op Pain Management:    Induction: Intravenous  PONV Risk Score and Plan: Propofol infusion and TIVA  Airway Management Planned: Natural Airway and Nasal Cannula  Additional Equipment:   Intra-op Plan:   Post-operative Plan:   Informed Consent: I have reviewed the patients History and Physical, chart, labs and discussed the procedure including the risks, benefits and alternatives for the proposed anesthesia with the patient or authorized representative who has indicated his/her understanding and acceptance.     Dental Advisory Given  Plan Discussed with: Anesthesiologist, CRNA and Surgeon  Anesthesia Plan Comments: (Patient consented for risks of anesthesia including but not limited to:  - adverse reactions to medications - risk of airway placement if required - damage to eyes, teeth, lips or other oral mucosa - nerve damage due to positioning  - sore throat or hoarseness - Damage to heart, brain, nerves, lungs, other parts of body or loss of life  Patient voiced understanding.)        Anesthesia Quick Evaluation

## 2020-12-07 NOTE — Anesthesia Postprocedure Evaluation (Signed)
Anesthesia Post Note  Patient: Kendra Anderson  Procedure(s) Performed: COLONOSCOPY WITH PROPOFOL  Patient location during evaluation: Endoscopy Anesthesia Type: General Level of consciousness: awake and alert Pain management: pain level controlled Vital Signs Assessment: post-procedure vital signs reviewed and stable Respiratory status: spontaneous breathing, nonlabored ventilation, respiratory function stable and patient connected to nasal cannula oxygen Cardiovascular status: blood pressure returned to baseline and stable Postop Assessment: no apparent nausea or vomiting Anesthetic complications: no   No notable events documented.   Last Vitals:  Vitals:   12/07/20 0900 12/07/20 0910  BP: (!) 104/53 110/67  Pulse: 84 80  Resp: 19 19  Temp:    SpO2: 97% 99%    Last Pain:  Vitals:   12/07/20 0715  TempSrc: Temporal  PainSc: 0-No pain                 Cleda Mccreedy Zadia Uhde

## 2020-12-08 ENCOUNTER — Encounter: Payer: Self-pay | Admitting: Internal Medicine

## 2020-12-08 LAB — SURGICAL PATHOLOGY

## 2020-12-21 ENCOUNTER — Other Ambulatory Visit: Payer: Self-pay

## 2020-12-21 ENCOUNTER — Encounter: Payer: Self-pay | Admitting: Obstetrics and Gynecology

## 2020-12-21 ENCOUNTER — Ambulatory Visit (INDEPENDENT_AMBULATORY_CARE_PROVIDER_SITE_OTHER): Payer: Managed Care, Other (non HMO) | Admitting: Obstetrics and Gynecology

## 2020-12-21 VITALS — BP 142/90 | Ht 61.0 in | Wt 265.0 lb

## 2020-12-21 DIAGNOSIS — Z1339 Encounter for screening examination for other mental health and behavioral disorders: Secondary | ICD-10-CM | POA: Diagnosis not present

## 2020-12-21 DIAGNOSIS — Z01419 Encounter for gynecological examination (general) (routine) without abnormal findings: Secondary | ICD-10-CM

## 2020-12-21 DIAGNOSIS — Z1331 Encounter for screening for depression: Secondary | ICD-10-CM | POA: Diagnosis not present

## 2020-12-21 DIAGNOSIS — Z3042 Encounter for surveillance of injectable contraceptive: Secondary | ICD-10-CM | POA: Diagnosis not present

## 2020-12-21 MED ORDER — MEDROXYPROGESTERONE ACETATE 150 MG/ML IM SUSY: PREFILLED_SYRINGE | INTRAMUSCULAR | 4 refills | Status: AC

## 2020-12-21 NOTE — Progress Notes (Signed)
Gynecology Annual Exam  PCP: Birdie Sons, MD  Chief Complaint  Patient presents with   Annual Exam    History of Present Illness:  Ms. Kendra Anderson is a 50 y.o. G0P0000 who LMP was No LMP recorded. Patient has had an injection., presents today for her annual examination.  Her menses are absent on Depo Provera.    She does not have vasomotor sx.   She is not sexually active. She does not have vaginal dryness.    Last Pap: 09/13/2017  Results were: no abnormalities, HPV DNA not done (last done and negative in 2018) Hx of STDs: none  Last mammogram: 11/18/2019  Results were: normal--routine follow-up in 12 months There is no FH of breast cancer. There is no FH of ovarian cancer. The patient does do self-breast exams.  Colonoscopy: 12/07/2020. Negative.  Follow up 10 years. DEXA: has not been screened   Tobacco use: smoked 1/2 ppd  Alcohol use: none Exercise: she has a tear in her meniscus. She does a little walking and some stationary bike.  The patient wears seatbelts: yes.     The patient's past medical history is notable for a history of DM Type II, morbid obesity, stage 3 CKD,  Hypothyroidism, hyperlipidemia, and hypertension. Dr Gabriel Carina is her endocrinologist and Dr Holley Raring is her internist/kidney doctor.. Dr Alm Bustard is her PCP.   Past Medical History:  Diagnosis Date   Anxiety    Chronic kidney disease    Depression    mild, no meds   Diabetes mellitus with nephropathy ([place])    Family history of breast cancer 10/2016   BRCA genetic testing letter sent   GERD (gastroesophageal reflux disease)    Hyperlipidemia    Hypertension    Hypothyroidism (acquired)    IBS (irritable bowel syndrome)    Morbid obesity ([place])    Tobacco abuse     Past Surgical History:  Procedure Laterality Date   CHOLECYSTECTOMY  2005   COLONOSCOPY  2011   Dr. Vira Agar   COLONOSCOPY WITH PROPOFOL N/A 07/08/2017   Procedure: COLONOSCOPY WITH PROPOFOL;  Surgeon: Manya Silvas, MD;   Location: [facility] ENDOSCOPY;  Service: Endoscopy;  Laterality: N/A;   COLONOSCOPY WITH PROPOFOL N/A 12/07/2020   Procedure: COLONOSCOPY WITH PROPOFOL;  Surgeon: Toledo, Benay Pike, MD;  Location: [facility] ENDOSCOPY;  Service: Gastroenterology;  Laterality: N/A;  IDDM   kidney stones removed  2012    Prior to Admission medications   Medication Sig Start Date End Date Taking? Authorizing Provider  amLODipine (NORVASC) 10 MG tablet Take 10 mg by mouth daily.  07/05/14  Yes [provider]  amoxicillin (AMOXIL) 875 MG tablet amoxicillin 875 mg tablet   Yes [provider]  atorvastatin (LIPITOR) 40 MG tablet Take 40 mg by mouth daily. 10/20/19  Yes [provider]  enalapril (VASOTEC) 10 MG tablet Take 10 mg by mouth daily.  07/05/14  Yes [provider]  glucose blood (ONE TOUCH ULTRA TEST) test strip USE 4 TIMES DAILY 01/20/16  Yes [provider]  HUMALOG 100 UNIT/ML injection  08/16/16  Yes [provider]  Insulin Human (INSULIN PUMP) SOLN Inject into the skin.   Yes [provider]  insulin lispro (HUMALOG) 100 UNIT/ML injection Inject 100 Units into the skin continuous.   Yes [provider]  Insulin Pen Needle (FIFTY50 PEN NEEDLES) 31G X 8 MM MISC USE ONCE DAILY WITH [place] INJECTION 02/28/15  Yes [provider]  [place]  MISC Use 1 each 5 (five) times daily. 02/28/15  Yes [provider]  levothyroxine (SYNTHROID, LEVOTHROID) 112 MCG tablet Take 112 mcg by mouth daily before breakfast.  05/31/14  Yes [provider]  medroxyPROGESTERone Acetate 150 MG/ML SUSY Inject 1 mL (150 mg total) into the muscle every 3 (three) months. 07/08/19  Yes Dalia Heading, CNM  metFORMIN (GLUCOPHAGE) 1000 MG tablet Take 1,000 mg by mouth 2 (two) times daily with a meal.  05/31/14  Yes [provider]  Multiple Vitamin (MULTI-VITAMINS) TABS Take 1 tablet by mouth daily.   Yes [provider]   traMADol (ULTRAM) 50 MG tablet Take 50 mg by mouth every 6 (six) hours as needed. 11/02/19  Yes [provider]  [place] 18 MG/3ML SOPN  04/23/14  Yes [provider]   Allergies  Allergen Reactions   Isometheptene-Dichloral-Apap Hives    MIDRIN   Obstetric History: G0P0000  Family History  Problem Relation Age of Onset   Diabetes Mother    Cancer Mother 58       started in gallbladder and mets to liver and bone   Diabetes Father    Stroke Father 71   Diabetes Sister    Lung cancer Maternal Grandfather 75   Heart disease Paternal Grandfather    Breast cancer Other        paternal great aunts x 5    Social History   Socioeconomic History   Marital status: Divorced    Spouse name: Not on file   Number of children: 0   Years of education: Not on file   Highest education level: Not on file  Occupational History   Occupation: Customer Service  Tobacco Use   Smoking status: Every Day    Packs/day: 0.25    Years: 12.00    Pack years: 3.00    Types: Cigarettes   Smokeless tobacco: Never   Tobacco comments:    1/2 ppd   Vaping Use   Vaping Use: Never used  Substance and Sexual Activity   Alcohol use: No    Alcohol/week: 0.0 standard drinks   Drug use: No   Sexual activity: Not Currently    Birth control/protection: Injection  Other Topics Concern   Not on file  Social History Narrative   Not on file   Social Determinants of Health   Financial Resource Strain: Not on file  Food Insecurity: Not on file  Transportation Needs: Not on file  Physical Activity: Not on file  Stress: Not on file  Social Connections: Not on file  Intimate Partner Violence: Not on file    Review of Systems  Constitutional: Negative.   HENT: Negative.    Eyes: Negative.   Respiratory: Negative.    Cardiovascular: Negative.   Gastrointestinal: Negative.   Genitourinary: Negative.   Musculoskeletal: Negative.   Skin: Negative.   Neurological: Negative.    Psychiatric/Behavioral: Negative.      Physical Exam BP (!) 142/90   Ht 5' 1" (1.549 m)   Wt 265 lb (120.2 kg)   BMI 50.07 kg/m   Physical Exam Constitutional:      General: She is not in acute distress.    Appearance: Normal appearance. She is well-developed.  Genitourinary:     Vulva and bladder normal.     Genitourinary Comments: Bimanual limited by body habitus     Right Labia: No rash, tenderness, lesions, skin changes or Bartholin's cyst.    Left Labia: No tenderness, lesions, skin changes, Bartholin's  cyst or rash.    No inguinal adenopathy present in the right or left side.    Pelvic Tanner Score: 5/5.    No vaginal discharge, erythema, tenderness or bleeding.      Right Adnexa: not tender, not full and no mass present.    Left Adnexa: not tender, not full and no mass present.    No cervical motion tenderness, discharge, lesion or polyp.     Uterus is not enlarged or tender.     No uterine mass detected.    Pelvic exam was performed with patient in the lithotomy position.  Breasts:    Right: No inverted nipple, mass, nipple discharge, skin change or tenderness.     Left: No inverted nipple, mass, nipple discharge, skin change or tenderness.  HENT:     Head: Normocephalic and atraumatic.  Eyes:     General: No scleral icterus.    Conjunctiva/sclera: Conjunctivae normal.  Neck:     Thyroid: No thyromegaly.  Cardiovascular:     Rate and Rhythm: Normal rate and regular rhythm.     Heart sounds: Murmur (II-III/VI, early to holosystolic murmur noted over left 2nd interspace) heard.    No friction rub. No gallop.  Pulmonary:     Effort: Pulmonary effort is normal. No respiratory distress.     Breath sounds: Normal breath sounds. No wheezing or rales.  Abdominal:     General: Bowel sounds are normal. There is no distension.     Palpations: Abdomen is soft. There is no mass.     Tenderness: There is no abdominal tenderness. There is no guarding or rebound.     Hernia:  There is no hernia in the left inguinal area or right inguinal area.  Musculoskeletal:        General: No swelling or tenderness. Normal range of motion.     Cervical back: Normal range of motion and neck supple.  Lymphadenopathy:     Cervical: No cervical adenopathy.     Lower Body: No right inguinal and no right inguinal adenopathy. No left inguinal and no left inguinal adenopathy.  Neurological:     General: No focal deficit present.     Mental Status: She is alert and oriented to person, place, and time.     Cranial Nerves: No cranial nerve deficit.  Skin:    General: Skin is warm and dry.     Findings: No erythema or rash.  Psychiatric:        Mood and Affect: Mood normal.        Behavior: Behavior normal.        Judgment: Judgment normal.    Female chaperone present for pelvic and breast  portions of the physical exam  Results: AUDIT Questionnaire (screen for alcoholism): 0 PHQ-9: 0  Assessment: 50 y.o. G0P0000 female here for routine gynecologic examination.  Plan: Problem List Items Addressed This Visit   None Visit Diagnoses     Women's annual routine gynecological examination    -  Primary   Relevant Medications   medroxyPROGESTERone Acetate 150 MG/ML SUSY   Screening for depression       Screening for alcoholism       Encounter for surveillance of injectable contraceptive       Depo-Provera contraceptive status       Relevant Medications   medroxyPROGESTERone Acetate 150 MG/ML SUSY      Screening: -- Blood pressure screen managed by PCP -- Colonoscopy - not due -- Mammogram - due.  Patient to call [place] to arrange. She understands that it is her responsibility to arrange this. -- Weight screening: obese: discussed management options, including lifestyle, dietary, and exercise. -- Depression screening negative (PHQ-9) -- Nutrition: normal -- cholesterol screening: per PCP -- osteoporosis screening: not due -- tobacco screening: using: discussed  quitting using the 5 A's -- alcohol screening: AUDIT questionnaire indicates low-risk usage. -- family history of breast cancer screening: done. not at high risk. -- no evidence of domestic violence or intimate partner violence. -- STD screening: gonorrhea/chlamydia NAAT not collected per patient request. -- pap smear not collected per ASCCP guidelines -- Received COVID19 vaccines  Heart Murmur:  Discussed with patient. I could not find it mentioned in prior exams from other providers. I asked her to contact her PCP regarding any recommended workup for this.  Prentice Docker, MD 12/21/2020 9:06 AM

## 2021-02-28 ENCOUNTER — Ambulatory Visit: Payer: Managed Care, Other (non HMO)

## 2021-03-07 ENCOUNTER — Other Ambulatory Visit: Payer: Self-pay

## 2021-03-07 ENCOUNTER — Ambulatory Visit (INDEPENDENT_AMBULATORY_CARE_PROVIDER_SITE_OTHER): Payer: Managed Care, Other (non HMO)

## 2021-03-07 DIAGNOSIS — Z3042 Encounter for surveillance of injectable contraceptive: Secondary | ICD-10-CM

## 2021-03-07 MED ORDER — MEDROXYPROGESTERONE ACETATE 150 MG/ML IM SUSP
150.0000 mg | Freq: Once | INTRAMUSCULAR | Status: AC
  Administered 2021-03-07: 150 mg via INTRAMUSCULAR

## 2021-03-07 NOTE — Progress Notes (Signed)
Pt here for depo which was given IM right glut.  Pt tolerated well.  NDC# 03212-248-25  Pt states she is going to follow [facility] but could she still get her inj here.  Adv if she is going to see [facility] she needs to get her inj at his new office.  Adv to fill out ROI at check out, call [place] to schedule next inj, if problems to call and schedule with [place] and when she has new next annual with him it should straighten out.

## 2021-04-08 IMAGING — MG DIGITAL SCREENING BILAT W/ TOMO W/ CAD
6 of 12 series · 6 of 36 positions shown · non-contrast
Comparison: Previous exam(s).

ACR Breast Density Category a: The breast tissue is almost entirely
fatty.

CLINICAL DATA: Screening.

EXAM:
DIGITAL SCREENING BILATERAL MAMMOGRAM WITH TOMO AND CAD

[L CC synth-2D]
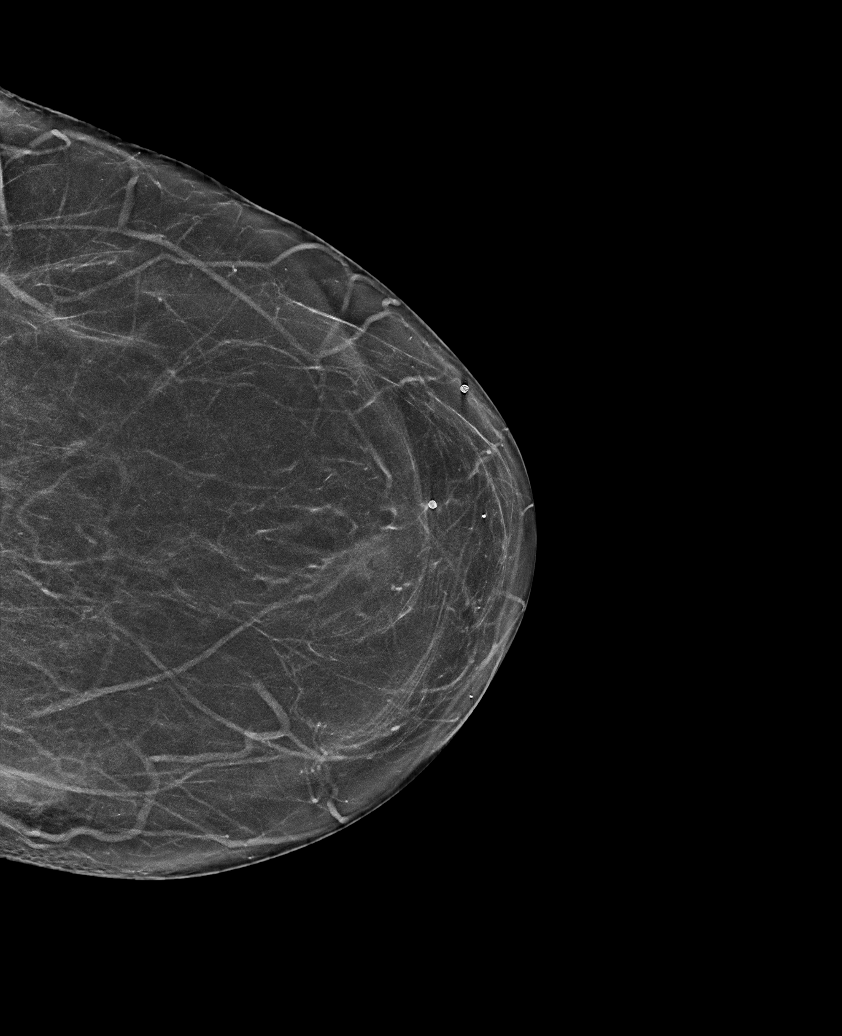

[R MLO synth-2D (1 of 2)]
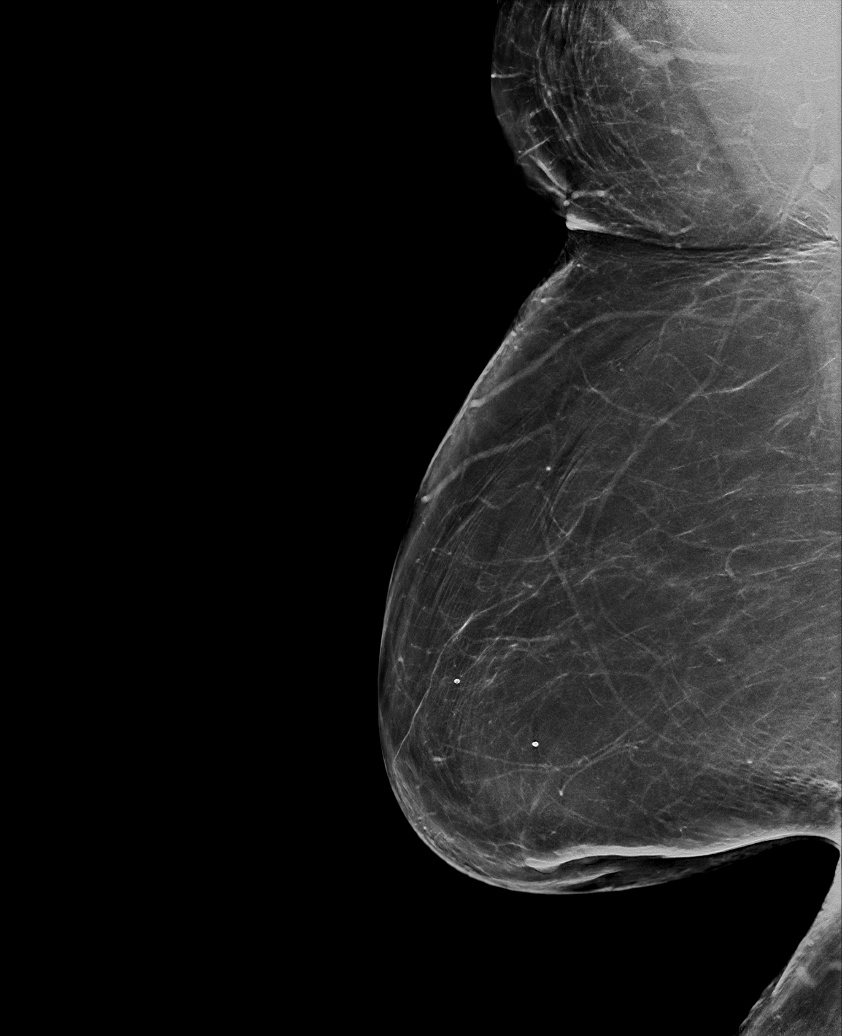

[L MLO synth-2D (1 of 2)]
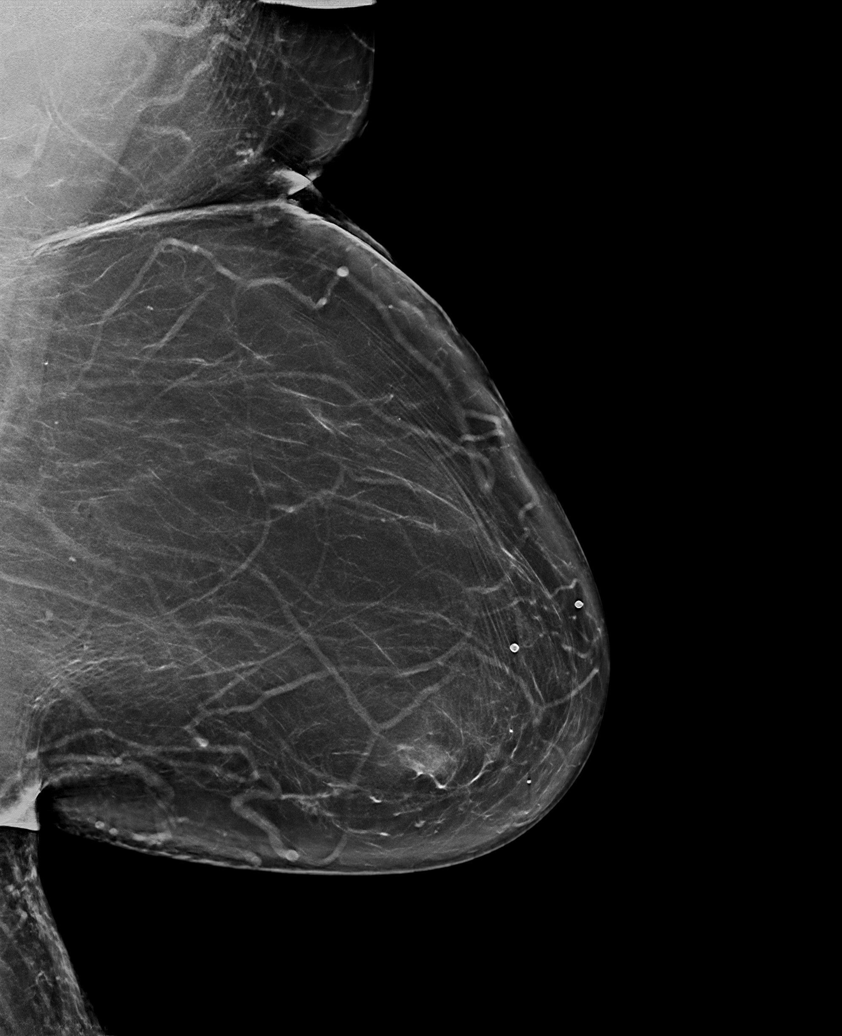

[R CC synth-2D]
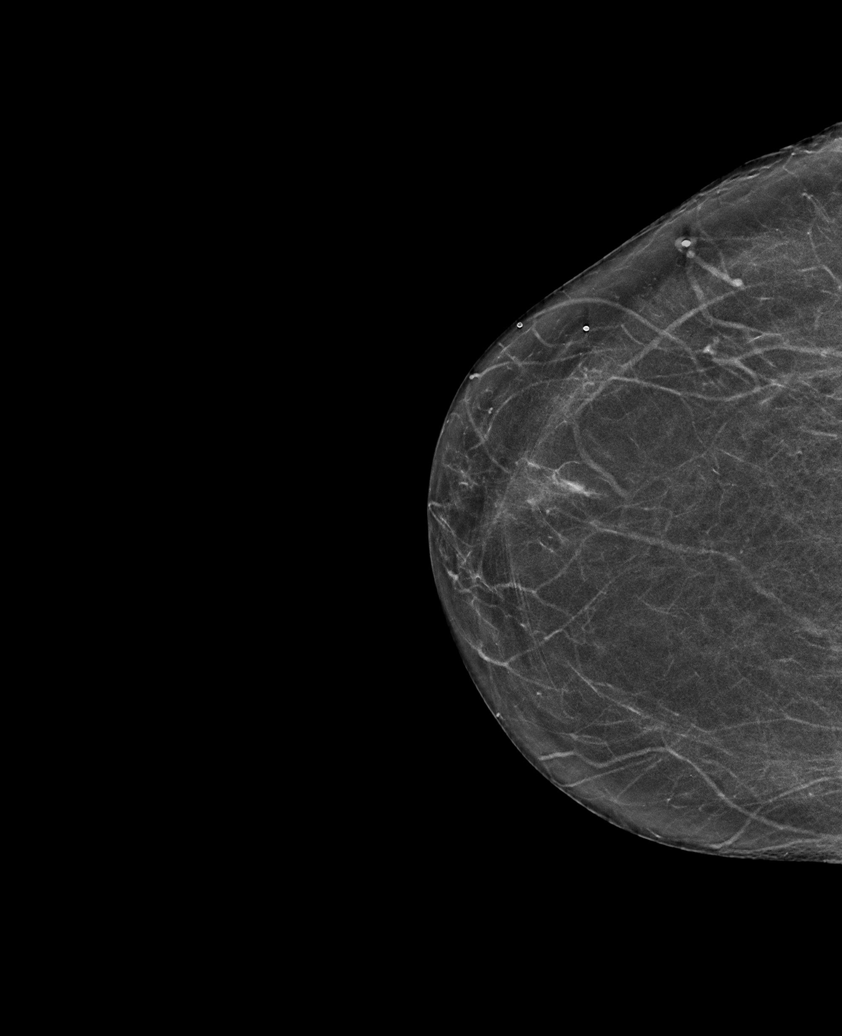

[R MLO synth-2D (2 of 2)]
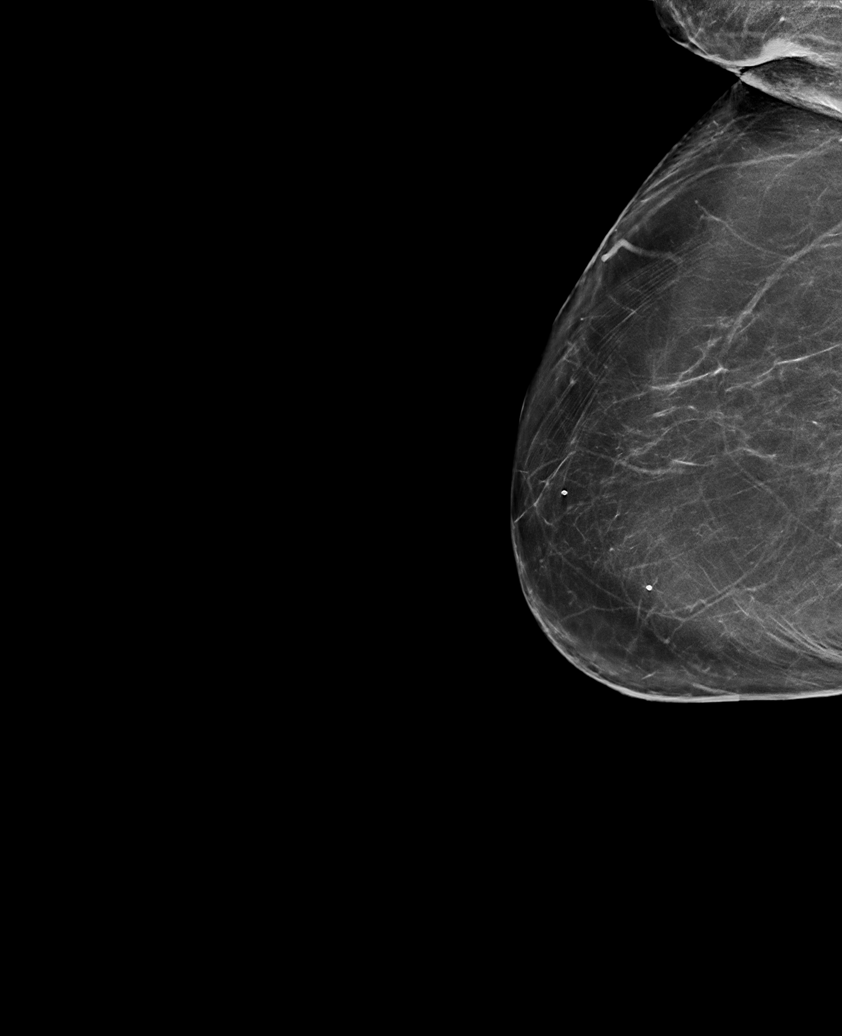

[L MLO synth-2D (2 of 2)]
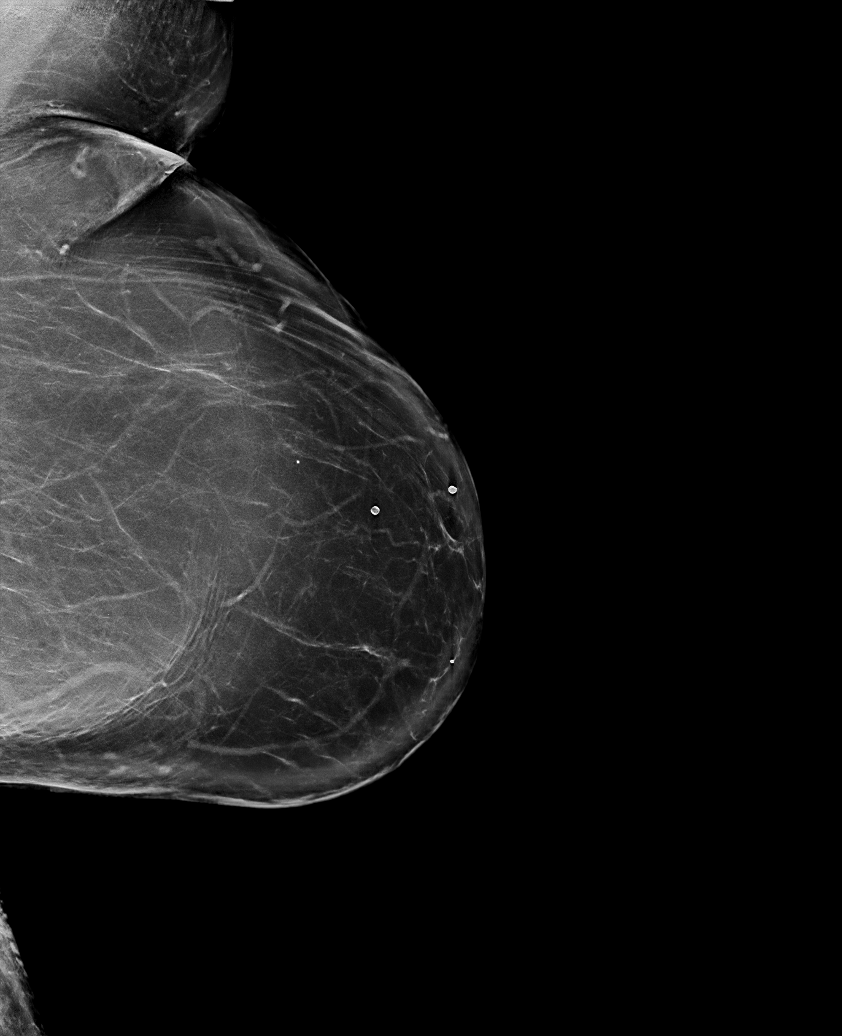

[6 of 36 positions shown; findings below may reference images not displayed]

FINDINGS: There are no findings suspicious for malignancy. Images were
processed with CAD.
IMPRESSION: No mammographic evidence of malignancy. A result letter of this
screening mammogram will be mailed directly to the patient.

RECOMMENDATION:
Screening mammogram in one year. (Code:8Y-Q-VVS)

BI-RADS CATEGORY  1: Negative.

## 2021-04-25 LAB — HM DIABETES EYE EXAM

## 2021-06-06 ENCOUNTER — Ambulatory Visit (INDEPENDENT_AMBULATORY_CARE_PROVIDER_SITE_OTHER): Payer: Managed Care, Other (non HMO)

## 2021-06-06 ENCOUNTER — Other Ambulatory Visit: Payer: Self-pay

## 2021-06-06 DIAGNOSIS — Z3042 Encounter for surveillance of injectable contraceptive: Secondary | ICD-10-CM | POA: Diagnosis not present

## 2021-06-06 MED ORDER — MEDROXYPROGESTERONE ACETATE 150 MG/ML IM SUSP
150.0000 mg | Freq: Once | INTRAMUSCULAR | Status: AC
  Administered 2021-06-06: 150 mg via INTRAMUSCULAR

## 2021-06-06 NOTE — Progress Notes (Signed)
Pt here for depo which was given IM right glut.  Pt tolerated well.  NDC# 66993-371-79 

## 2021-09-07 ENCOUNTER — Ambulatory Visit (INDEPENDENT_AMBULATORY_CARE_PROVIDER_SITE_OTHER): Payer: Managed Care, Other (non HMO)

## 2021-09-07 DIAGNOSIS — Z3042 Encounter for surveillance of injectable contraceptive: Secondary | ICD-10-CM | POA: Diagnosis not present

## 2021-09-07 LAB — POCT URINE PREGNANCY: Preg Test, Ur: NEGATIVE

## 2021-09-07 MED ORDER — MEDROXYPROGESTERONE ACETATE 150 MG/ML IM SUSP
150.0000 mg | Freq: Once | INTRAMUSCULAR | Status: AC
  Administered 2021-09-07: 150 mg via INTRAMUSCULAR

## 2021-11-28 ENCOUNTER — Ambulatory Visit (INDEPENDENT_AMBULATORY_CARE_PROVIDER_SITE_OTHER): Payer: Managed Care, Other (non HMO)

## 2021-11-28 VITALS — BP 108/70 | HR 94 | Ht 61.0 in | Wt 248.0 lb

## 2021-11-28 DIAGNOSIS — Z3042 Encounter for surveillance of injectable contraceptive: Secondary | ICD-10-CM

## 2021-11-28 MED ORDER — MEDROXYPROGESTERONE ACETATE 150 MG/ML IM SUSP
150.0000 mg | Freq: Once | INTRAMUSCULAR | Status: AC
  Administered 2021-11-28: 150 mg via INTRAMUSCULAR

## 2021-11-28 NOTE — Progress Notes (Signed)
Date last pap: 09/13/17. Last Depo-Provera: 09/07/21. Side Effects if any: N/A. Serum HCG indicated? None. Depo-Provera 150 mg IM given by: Rocco Serene, LPN. Site: Right [place]. Next appointment due 02/12/22 - 02/26/22.   Patient next Annual due 12/21/21. She reports she has an appointment with Dr. Jean Rosenthal at [facility] 01/04/22. She will be transferring her care.

## 2021-12-25 LAB — RESULTS CONSOLE HPV: CHL HPV: POSITIVE

## 2021-12-25 LAB — HEMOGLOBIN A1C: Hemoglobin A1C: 6.8

## 2021-12-26 ENCOUNTER — Other Ambulatory Visit: Payer: Self-pay | Admitting: Obstetrics and Gynecology

## 2021-12-26 DIAGNOSIS — Z1231 Encounter for screening mammogram for malignant neoplasm of breast: Secondary | ICD-10-CM

## 2021-12-28 LAB — HM PAP SMEAR: HM Pap smear: NEGATIVE

## 2022-01-23 ENCOUNTER — Ambulatory Visit
Admission: RE | Admit: 2022-01-23 | Discharge: 2022-01-23 | Disposition: A | Discharge status: Home / Self Care | Payer: Managed Care, Other (non HMO) | Source: Ambulatory Visit | Attending: Obstetrics and Gynecology | Admitting: Obstetrics and Gynecology

## 2022-01-23 DIAGNOSIS — Z1231 Encounter for screening mammogram for malignant neoplasm of breast: Secondary | ICD-10-CM | POA: Diagnosis not present

## 2022-01-23 LAB — HM MAMMOGRAPHY

## 2022-01-24 LAB — CBC AND DIFFERENTIAL
HCT: 42 (ref 36–46)
Hemoglobin: 14.2 (ref 12.0–16.0)
Platelets: 306 10*3/uL (ref 150–400)
WBC: 10.8

## 2022-01-24 LAB — BASIC METABOLIC PANEL
BUN: 19 (ref 4–21)
CO2: 19 (ref 13–22)
Chloride: 101 (ref 99–108)
Creatinine: 1.2 — AB (ref 0.5–1.1)
Glucose: 275
Potassium: 4.6 mEq/L (ref 3.5–5.1)
Sodium: 138 (ref 137–147)

## 2022-01-24 LAB — COMPREHENSIVE METABOLIC PANEL: eGFR: 54

## 2022-01-24 LAB — PROTEIN / CREATININE RATIO, URINE
Creatinine Random, Urine: 48.2
Prot/Creat Ratio, Ur: 448
Protein, Urine: 21.6

## 2022-02-09 ENCOUNTER — Ambulatory Visit: Payer: Managed Care, Other (non HMO) | Admitting: Physician Assistant

## 2022-02-09 ENCOUNTER — Encounter: Payer: Self-pay | Admitting: Physician Assistant

## 2022-02-09 VITALS — BP 116/72 | HR 93 | Temp 98.1°F | Wt 247.3 lb

## 2022-02-09 DIAGNOSIS — F17210 Nicotine dependence, cigarettes, uncomplicated: Secondary | ICD-10-CM | POA: Diagnosis not present

## 2022-02-09 DIAGNOSIS — J069 Acute upper respiratory infection, unspecified: Secondary | ICD-10-CM

## 2022-02-09 MED ORDER — BENZONATATE 100 MG PO CAPS: 100.0000 mg | ORAL_CAPSULE | Freq: Two times a day (BID) | ORAL | 0 refills | Status: DC | PRN

## 2022-02-09 MED ORDER — ALBUTEROL SULFATE HFA 108 (90 BASE) MCG/ACT IN AERS: 2.0000 | INHALATION_SPRAY | Freq: Four times a day (QID) | RESPIRATORY_TRACT | 2 refills | Status: DC | PRN

## 2022-02-09 NOTE — Progress Notes (Signed)
I,Sha'taria Tyson,acting as a Neurosurgeon for [employer], PA-C.,have documented all relevant documentation on the behalf of Alfredia Ferguson, PA-C,as directed by  Alfredia Ferguson, PA-C while in the presence of Alfredia Ferguson, PA-C.   Established patient visit   Patient: Kendra Anderson   DOB: [birth year]   51 y.o. Female  MRN: [id number] Visit Date: 02/09/2022  Today's healthcare provider: Alfredia Ferguson, PA-C   Cc. Cough, wheezing, 1 day  Subjective     Maysel is a 51 y/o female with controlled DM II, current smoker, who presents today with cough, nasal congestion, sore throat, wheezing x 1 day. She reports these symptoms always turn into bronchitis for her and she wants to take precaution. Denies sob, chest pain, fevers.  Medications: Outpatient Medications Prior to Visit  Medication Sig   amLODipine (NORVASC) 10 MG tablet Take 10 mg by mouth daily.    atorvastatin (LIPITOR) 40 MG tablet Take 40 mg by mouth daily.   colestipol (COLESTID) 1 g tablet Take 1 g by mouth 2 (two) times daily.   enalapril (VASOTEC) 10 MG tablet Take 10 mg by mouth daily.    glucose blood test strip USE 4 TIMES DAILY   HUMALOG 100 UNIT/ML injection    Insulin Human (INSULIN PUMP) SOLN Inject into the skin.   insulin lispro (HUMALOG) 100 UNIT/ML injection Inject 100 Units into the skin continuous.   Insulin Pen Needle 31G X 8 MM MISC USE ONCE DAILY WITH [place] INJECTION   LANCETS MICRO THIN 33G MISC Use 1 each 5 (five) times daily.   levothyroxine (SYNTHROID, LEVOTHROID) 112 MCG tablet Take 112 mcg by mouth daily before breakfast.    medroxyPROGESTERone Acetate 150 MG/ML SUSY ADMINISTER 1 ML(150 MG) IN THE MUSCLE EVERY 3 MONTHS   metFORMIN (GLUCOPHAGE) 1000 MG tablet Take 1,000 mg by mouth 2 (two) times daily with a meal.    Multiple Vitamin (MULTI-VITAMINS) TABS Take 1 tablet by mouth daily.   Semaglutide, 1 MG/DOSE, (OZEMPIC, 1 MG/DOSE,) 2 MG/1.5ML SOPN Ozempic 1 mg/dose (2 mg/1.5 mL) subcutaneous  pen injector   traMADol (ULTRAM) 50 MG tablet Take 50 mg by mouth every 6 (six) hours as needed.   No facility-administered medications prior to visit.    Review of Systems  HENT:  Positive for sore throat.   Respiratory:  Positive for cough and wheezing.   Neurological:  Positive for headaches.     Objective    Blood pressure 116/72, pulse 93, temperature 98.1 F (36.7 C), temperature source Oral, weight 247 lb 4.8 oz (112.2 kg), SpO2 98 %.   Physical Exam Constitutional:      General: She is awake.     Appearance: She is well-developed.  HENT:     Head: Normocephalic.     Right Ear: A middle ear effusion is present. Tympanic membrane is not injected or erythematous.     Left Ear: A middle ear effusion is present. Tympanic membrane is not injected or erythematous.     Nose: Congestion present.     Mouth/Throat:     Mouth: Mucous membranes are moist.     Pharynx: Posterior oropharyngeal erythema present. No oropharyngeal exudate.  Eyes:     Conjunctiva/sclera: Conjunctivae normal.  Cardiovascular:     Rate and Rhythm: Normal rate and regular rhythm.     Heart sounds: Normal heart sounds.  Pulmonary:     Effort: Pulmonary effort is normal.     Breath sounds: Normal breath sounds. No stridor. No wheezing, rhonchi  or rales.  Skin:    General: Skin is warm.  Neurological:     Mental Status: She is alert and oriented to person, place, and time.  Psychiatric:        Attention and Perception: Attention normal.        Mood and Affect: Mood normal.        Speech: Speech normal.        Behavior: Behavior is cooperative.     Assessment & Plan     URI Educated on viral vs bacterial respiratory illness, educated on antibiotic use in respiratory illness Lungs cta today, vitals WNR, O2 at 98% Given pt hx of DM and smoking, rx albuterol PRN wheezing/chest tightness  Advised increase fluids, mucinex bid, start antihistamine, tylenol for pain Rx tessalon for cough, but advised  coughing will be beneficial to prevent a bacterial infection  Return if symptoms worsen or fail to improve. Call office/message if symptoms not cleared by day 6-7    I, Alfredia Ferguson, PA-C have reviewed all documentation for this visit. The documentation on  02/09/2022  for the exam, diagnosis, procedures, and orders are all accurate and complete.  Alfredia Ferguson, PA-C [facility] [address] [place], [place], [address] Office: [phone] Fax: [phone]   [facility]

## 2022-02-09 NOTE — Patient Instructions (Addendum)
Mucinex over the counter Guaifenesin 12 hours   Tylenol for pain   Antihistamine -- zyrtec or claritin once daily

## 2022-02-12 ENCOUNTER — Encounter: Payer: Self-pay | Admitting: Family Medicine

## 2022-02-24 ENCOUNTER — Encounter: Payer: Self-pay | Admitting: Physician Assistant

## 2022-02-26 ENCOUNTER — Other Ambulatory Visit: Payer: Self-pay | Admitting: Physician Assistant

## 2022-02-26 MED ORDER — AMOXICILLIN 875 MG PO TABS: 875.0000 mg | ORAL_TABLET | Freq: Two times a day (BID) | ORAL | 0 refills | Status: AC

## 2022-03-22 ENCOUNTER — Encounter: Payer: Self-pay | Admitting: Family Medicine

## 2022-10-16 ENCOUNTER — Ambulatory Visit: Payer: Managed Care, Other (non HMO) | Admitting: Family Medicine

## 2022-10-16 ENCOUNTER — Encounter: Payer: Self-pay | Admitting: Family Medicine

## 2022-10-16 VITALS — BP 114/67 | HR 88 | Temp 98.6°F | Ht 61.0 in | Wt 247.0 lb

## 2022-10-16 DIAGNOSIS — E65 Localized adiposity: Secondary | ICD-10-CM

## 2022-10-16 DIAGNOSIS — E1129 Type 2 diabetes mellitus with other diabetic kidney complication: Secondary | ICD-10-CM

## 2022-10-16 DIAGNOSIS — E1159 Type 2 diabetes mellitus with other circulatory complications: Secondary | ICD-10-CM | POA: Diagnosis not present

## 2022-10-16 DIAGNOSIS — G8929 Other chronic pain: Secondary | ICD-10-CM | POA: Insufficient documentation

## 2022-10-16 DIAGNOSIS — Z9989 Dependence on other enabling machines and devices: Secondary | ICD-10-CM

## 2022-10-16 DIAGNOSIS — E1169 Type 2 diabetes mellitus with other specified complication: Secondary | ICD-10-CM | POA: Insufficient documentation

## 2022-10-16 DIAGNOSIS — M25561 Pain in right knee: Secondary | ICD-10-CM

## 2022-10-16 DIAGNOSIS — L732 Hidradenitis suppurativa: Secondary | ICD-10-CM | POA: Diagnosis not present

## 2022-10-16 DIAGNOSIS — E1122 Type 2 diabetes mellitus with diabetic chronic kidney disease: Secondary | ICD-10-CM

## 2022-10-16 DIAGNOSIS — F17200 Nicotine dependence, unspecified, uncomplicated: Secondary | ICD-10-CM

## 2022-10-16 DIAGNOSIS — L03315 Cellulitis of perineum: Secondary | ICD-10-CM | POA: Diagnosis not present

## 2022-10-16 DIAGNOSIS — Z789 Other specified health status: Secondary | ICD-10-CM

## 2022-10-16 DIAGNOSIS — Z794 Long term (current) use of insulin: Secondary | ICD-10-CM

## 2022-10-16 DIAGNOSIS — I152 Hypertension secondary to endocrine disorders: Secondary | ICD-10-CM | POA: Insufficient documentation

## 2022-10-16 MED ORDER — SULFAMETHOXAZOLE-TRIMETHOPRIM 800-160 MG PO TABS: 1.0000 | ORAL_TABLET | Freq: Two times a day (BID) | ORAL | 0 refills | Status: DC

## 2022-10-16 NOTE — Progress Notes (Signed)
Established patient visit  Patient: Kendra Anderson   DOB: 20-Nov-1970   52 y.o. Female  MRN: [id number] Visit Date: 10/16/2022  Today's healthcare provider: Jacky Kindle, FNP  .Introduced to Publishing rights manager role and practice setting.  All questions answered.  Discussed provider/patient relationship and expectations.  Chief Complaint  Patient presents with   Recurrent abcess    Patient has an what she thinks is an abscess on her abdomen.  It has been present for about a week.  She said there was drainage a few days ago but none since.  She does feel it is full and needs to drain.  She states she has had them before and they have had to be lanced.    Subjective    HPI HPI     Recurrent abcess    Additional comments: Patient has an what she thinks is an abscess on her abdomen.  It has been present for about a week.  She said there was drainage a few days ago but none since.  She does feel it is full and needs to drain.  She states she has had them before and they have had to be lanced.       Last edited by Adline Peals, CMA on 10/16/2022 10:36 AM.      Medications: Outpatient Medications Prior to Visit  Medication Sig   albuterol (VENTOLIN HFA) 108 (90 Base) MCG/ACT inhaler Inhale 2 puffs into the lungs every 6 (six) hours as needed for wheezing or shortness of breath.   amLODipine (NORVASC) 10 MG tablet Take 10 mg by mouth daily.    atorvastatin (LIPITOR) 40 MG tablet Take 40 mg by mouth daily.   benzonatate (TESSALON) 100 MG capsule Take 1 capsule (100 mg total) by mouth 2 (two) times daily as needed for cough.   colestipol (COLESTID) 1 g tablet Take 1 g by mouth 2 (two) times daily.   enalapril (VASOTEC) 10 MG tablet Take 10 mg by mouth daily.    glucose blood test strip USE 4 TIMES DAILY   HUMALOG 100 UNIT/ML injection    Insulin Human (INSULIN PUMP) SOLN Inject into the skin.   insulin lispro (HUMALOG) 100 UNIT/ML injection Inject 100 Units into the skin continuous.    Insulin Pen Needle 31G X 8 MM MISC USE ONCE DAILY WITH VICTOZA INJECTION   LANCETS MICRO THIN 33G MISC Use 1 each 5 (five) times daily.   levothyroxine (SYNTHROID, LEVOTHROID) 112 MCG tablet Take 112 mcg by mouth daily before breakfast.    medroxyPROGESTERone Acetate 150 MG/ML SUSY ADMINISTER 1 ML(150 MG) IN THE MUSCLE EVERY 3 MONTHS   metFORMIN (GLUCOPHAGE) 1000 MG tablet Take 1,000 mg by mouth 2 (two) times daily with a meal.    Multiple Vitamin (MULTI-VITAMINS) TABS Take 1 tablet by mouth daily.   Semaglutide, 1 MG/DOSE, (OZEMPIC, 1 MG/DOSE,) 2 MG/1.5ML SOPN Ozempic 1 mg/dose (2 mg/1.5 mL) subcutaneous pen injector   traMADol (ULTRAM) 50 MG tablet Take 50 mg by mouth every 6 (six) hours as needed.   No facility-administered medications prior to visit.     Objective    BP 114/67 (BP Location: Left Arm, Patient Position: Sitting, Cuff Size: Large)   Pulse 88   Temp 98.6 F (37 C) (Oral)   Ht 5\' 1"  (1.549 m)   Wt 247 lb (112 kg)   SpO2 100%   BMI 46.67 kg/m   Physical Exam Vitals and nursing note reviewed.  Constitutional:  General: She is not in acute distress.    Appearance: Normal appearance. She is obese. She is not ill-appearing, toxic-appearing or diaphoretic.  HENT:     Head: Normocephalic and atraumatic.  Cardiovascular:     Rate and Rhythm: Normal rate and regular rhythm.     Pulses: Normal pulses.     Heart sounds: Normal heart sounds. No murmur heard.    No friction rub. No gallop.  Pulmonary:     Effort: Pulmonary effort is normal. No respiratory distress.     Breath sounds: Normal breath sounds. No stridor. No wheezing, rhonchi or rales.  Chest:     Chest wall: No tenderness.  Musculoskeletal:        General: No swelling, tenderness, deformity or signs of injury. Normal range of motion.     Right lower leg: No edema.     Left lower leg: No edema.  Skin:    General: Skin is warm and dry.     Capillary Refill: Capillary refill takes less than 2 seconds.      Coloration: Skin is not jaundiced or pale.     Findings: No bruising, erythema, lesion or rash.  Neurological:     General: No focal deficit present.     Mental Status: She is alert and oriented to person, place, and time. Mental status is at baseline.     Cranial Nerves: No cranial nerve deficit.     Sensory: No sensory deficit.     Motor: No weakness.     Coordination: Coordination normal.  Psychiatric:        Mood and Affect: Mood normal.        Behavior: Behavior normal.        Thought Content: Thought content normal.        Judgment: Judgment normal.     No results found for any visits on 10/16/22.  Assessment & Plan     Problem List Items Addressed This Visit       Cardiovascular and Mediastinum   Hypertension associated with diabetes (HCC)    Chronic, stable -Maintain current use of norvasc 10, enalapril 10        Endocrine   DM (diabetes mellitus), type 2 with renal complications (HCC)    Chronic, remains on lispro via pump Continue to recommend balanced, lower carb meals. Smaller meal size, adding snacks. Choosing water as drink of choice and increasing purposeful exercise. On Acei On Statin  A1c well controlled last check 10/23 Also on 1 mg ozempic weekly       Hyperlipidemia associated with type 2 diabetes mellitus (HCC)    Chronic, previously stable per 22 labs LDL goal <70 Continues on lipitor 40  Also on 1 g colestid BID   colestipol (COLESTID) 1 g tablet   recommend diet low in saturated fat and regular exercise - 30 min at least 5 times per week       Microalbuminuria due to type 2 diabetes mellitus (HCC)    Chronic, on ACE-inhibitor BP is controlled Continue to monitor DM control        Musculoskeletal and Integument   Hidradenitis suppurativa of multiple sites - Primary    Acute on chronic, large abscess, roughly size of grape fruit to L lower abdomen wall near mons pubis Pt request to lance; declined Recommend surgical consult  and wound care consult Cx sent Recommend ABX and close f/u with PCP      Relevant Medications   sulfamethoxazole-trimethoprim (BACTRIM DS) 800-160  MG tablet   Other Relevant Orders   Ambulatory referral to Plastic Surgery     Other   Abdominal pannus    Recommend consideration for abdominal skin removal given recurrent infections with known HS as well as ADL inability, chronic pain concerns and use of cane. Body mass index is 46.67 kg/m. Known hx of HTN, HLD, CKD as well as Hypothyroidism       Relevant Orders   Ambulatory referral to Plastic Surgery   Cellulitis of perineum    Acute on chronic, large abscess, roughly size of grape fruit to L lower abdomen wall near mons pubis Pt request to lance; declined Recommend surgical consult and wound care consult Cx sent Recommend ABX and close f/u with PCP      Relevant Orders   Ambulatory referral to General Surgery   Ambulatory referral to Wound Clinic   WOUND CULTURE   Ambulatory referral to Plastic Surgery   Chronic pain of both knees    Plans for upcoming knee sx; advised she may need to quit smoking Use of cane on exam      Relevant Orders   Ambulatory referral to Plastic Surgery   Inability to perform activities of daily living   Relevant Orders   Ambulatory referral to Plastic Surgery   Morbid obesity (HCC)    Chronic, Body mass index is 46.67 kg/m. Discussed importance of healthy weight management Discussed diet and exercise       Relevant Orders   Ambulatory referral to Plastic Surgery   Tobacco dependence    Chronic, stable Declines interventions despite plan for knee replacement sx and wound healing       Use of cane as ambulatory aid   Relevant Orders   Ambulatory referral to Plastic Surgery   Return in about 1 week (around 10/23/2022), or if symptoms worsen or fail to improve.     Leilani Merl, FNP, have reviewed all documentation for this visit. The documentation on 10/16/22 for the exam,  diagnosis, procedures, and orders are all accurate and complete.  Jacky Kindle, FNP  [facility] [phone] (phone) [phone] (fax)  [facility]

## 2022-10-16 NOTE — Assessment & Plan Note (Signed)
Chronic, on ACE-inhibitor BP is controlled Continue to monitor DM control

## 2022-10-16 NOTE — Assessment & Plan Note (Addendum)
Chronic, remains on lispro via pump Continue to recommend balanced, lower carb meals. Smaller meal size, adding snacks. Choosing water as drink of choice and increasing purposeful exercise. On Acei On Statin  A1c well controlled last check 10/23 Also on 1 mg ozempic weekly

## 2022-10-16 NOTE — Assessment & Plan Note (Signed)
Chronic, Body mass index is 46.67 kg/m. Discussed importance of healthy weight management Discussed diet and exercise

## 2022-10-16 NOTE — Assessment & Plan Note (Signed)
Acute on chronic, large abscess, roughly size of grape fruit to L lower abdomen wall near mons pubis Pt request to lance; declined Recommend surgical consult and wound care consult Cx sent Recommend ABX and close f/u with PCP

## 2022-10-16 NOTE — Assessment & Plan Note (Signed)
Plans for upcoming knee sx; advised she may need to quit smoking Use of cane on exam

## 2022-10-16 NOTE — Assessment & Plan Note (Signed)
Chronic, stable -Maintain current use of norvasc 10, enalapril 10

## 2022-10-16 NOTE — Assessment & Plan Note (Signed)
Recommend consideration for abdominal skin removal given recurrent infections with known HS as well as ADL inability, chronic pain concerns and use of cane. Body mass index is 46.67 kg/m. Known hx of HTN, HLD, CKD as well as Hypothyroidism

## 2022-10-16 NOTE — Assessment & Plan Note (Signed)
Chronic, stable Declines interventions despite plan for knee replacement sx and wound healing

## 2022-10-16 NOTE — Assessment & Plan Note (Addendum)
Chronic, previously stable per 22 labs LDL goal <70 Continues on lipitor 40  Also on 1 g colestid BID   colestipol (COLESTID) 1 g tablet   recommend diet low in saturated fat and regular exercise - 30 min at least 5 times per week

## 2022-10-17 LAB — WOUND CULTURE

## 2022-10-18 LAB — WOUND CULTURE: Organism ID, Bacteria: NONE SEEN

## 2022-10-19 ENCOUNTER — Encounter: Payer: Self-pay | Admitting: Family Medicine

## 2022-10-19 NOTE — Progress Notes (Signed)
ABX is correct for current bacteria; common bacteria seen in long term care facilities

## 2022-10-22 ENCOUNTER — Encounter: Payer: Self-pay | Admitting: Family Medicine

## 2022-10-22 ENCOUNTER — Ambulatory Visit: Payer: Managed Care, Other (non HMO) | Admitting: Family Medicine

## 2022-10-22 ENCOUNTER — Ambulatory Visit: Payer: Self-pay

## 2022-10-22 VITALS — BP 106/67 | HR 95 | Ht 61.0 in | Wt 243.4 lb

## 2022-10-22 DIAGNOSIS — L03311 Cellulitis of abdominal wall: Secondary | ICD-10-CM | POA: Diagnosis not present

## 2022-10-22 MED ORDER — SULFAMETHOXAZOLE-TRIMETHOPRIM 800-160 MG PO TABS: 1.0000 | ORAL_TABLET | Freq: Two times a day (BID) | ORAL | 0 refills | Status: AC

## 2022-10-22 NOTE — Telephone Encounter (Signed)
  Chief Complaint:  recurrent skin abscess to lower abdomen under skin fold to near top of perineal area Symptoms: fever Saturday, foul smelling reddish drainage moderate amount,size of navel orange Frequency: week and a half  Pertinent Negatives: Patient denies fever ths am spreading redness Disposition: [] ED /[] Urgent Care (no appt availability in office) / [x] Appointment(In office/virtual)/ []  [facility]/ [] Home Care/ [] Refused Recommended Disposition /[] [facility]/ []  Follow-up with PCP Additional Notes: pt stated a copious amount of drainage came out of wound and landed on floor that was reddish a very foul smelling Reason for Disposition  Boil > 2 inches across (> 5 cm; larger than a golf ball or ping pong ball)  Answer Assessment - Initial Assessment Questions 1. APPEARANCE of BOIL: "What does the boil look like?"      Red  2. LOCATION: "Where is the boil located?"      Skin fold  3. NUMBER: "How many boils are there?"      1 4. SIZE: "How big is the boil?" (e.g., inches, cm; compare to size of a coin or other object)     Size of navel arage   6. PAIN: "Is there any pain?" If Yes, ask: "How bad is the pain?"   (Scale 1-10; or mild, moderate, severe)     Mild  7. FEVER: "Do you have a fever?" If Yes, ask: "What is it, how was it measured, and when did it start?"      Yes on Saturday  8. SOURCE: "Have you been around anyone with boils or other Staph infections?" "Have you ever had boils before?"     yes 9. OTHER SYMPTOMS: "Do you have any other symptoms?" (e.g., shaking chills, weakness, rash elsewhere on body)     Dark red drainage - 5 times, fatigue , missed work  Protocols used: Boil (Skin Abscess)-A-AH

## 2022-10-22 NOTE — Progress Notes (Unsigned)
Established patient visit   Patient: Kendra Anderson   DOB: 01-16-1971   52 y.o. Female  MRN: [id number] Visit Date: 10/22/2022  Today's healthcare provider: Mila Merry, MD   Chief Complaint  Patient presents with   Wound Check    Patient reports wound is located under her belly. Patient reports it opened up on Friday and may have been present a week before that. Patient reports pain in 4/5 out of 10. Patient reports unsure of what cause wound. Patient reports discharge, and redness at wound site. Reports it is about half of the original size now that some fluids have come out. Patient reports she places gauze and bandages on it.    Subjective    Discussed the use of AI scribe software for clinical note transcription with the patient, who gave verbal consent to proceed.  History of Present Illness   Kendra Anderson, a patient with a history of recurrent skin abscesses, presents for follow-up of a particularly severe abscess on the lower abdomen. The abscess, described as the size of an orange, developed over the past week. Was seen by PA 6 days ago and prescribed Septra DS and referred to surgery for which she has an appointment later this week. The patient reports that the abscess opened and drained significantly on its own about 3 days ago. The drainage has since slowed, but the patient believes there is still material within the abscess. The abscess is tender and has been causing discomfort.  The patient experienced a single episode of fever (102F) associated with the abscess, which resolved after a day. However, the patient reports poor sleep and exhaustion since the abscess developed.  She has a history of similar abscesses, including on the abdomen, but describes this one as the worst. Her mother also had a history of similar skin issues, possibly diagnosed as DS.       Medications: Outpatient Medications Prior to Visit  Medication Sig   amLODipine (NORVASC) 10 MG tablet Take 10  mg by mouth daily.    atorvastatin (LIPITOR) 40 MG tablet Take 40 mg by mouth daily.   colestipol (COLESTID) 1 g tablet Take 1 g by mouth 2 (two) times daily.   enalapril (VASOTEC) 10 MG tablet Take 10 mg by mouth daily.    glucose blood test strip USE 4 TIMES DAILY   HUMALOG 100 UNIT/ML injection    Insulin Human (INSULIN PUMP) SOLN Inject into the skin.   insulin lispro (HUMALOG) 100 UNIT/ML injection Inject 100 Units into the skin continuous.   Insulin Pen Needle 31G X 8 MM MISC USE ONCE DAILY WITH VICTOZA INJECTION   LANCETS MICRO THIN 33G MISC Use 1 each 5 (five) times daily.   levothyroxine (SYNTHROID, LEVOTHROID) 112 MCG tablet Take 112 mcg by mouth daily before breakfast.    medroxyPROGESTERone Acetate 150 MG/ML SUSY ADMINISTER 1 ML(150 MG) IN THE MUSCLE EVERY 3 MONTHS   metFORMIN (GLUCOPHAGE) 1000 MG tablet Take 1,000 mg by mouth 2 (two) times daily with a meal.    Multiple Vitamin (MULTI-VITAMINS) TABS Take 1 tablet by mouth daily.   Semaglutide, 1 MG/DOSE, (OZEMPIC, 1 MG/DOSE,) 2 MG/1.5ML SOPN Ozempic 1 mg/dose (2 mg/1.5 mL) subcutaneous pen injector   traMADol (ULTRAM) 50 MG tablet Take 50 mg by mouth every 6 (six) hours as needed.   [DISCONTINUED] sulfamethoxazole-trimethoprim (BACTRIM DS) 800-160 MG tablet Take 1 tablet by mouth 2 (two) times daily.   albuterol (VENTOLIN HFA) 108 (90 Base) MCG/ACT inhaler  Inhale 2 puffs into the lungs every 6 (six) hours as needed for wheezing or shortness of breath. (Patient not taking: Reported on 10/22/2022)   benzonatate (TESSALON) 100 MG capsule Take 1 capsule (100 mg total) by mouth 2 (two) times daily as needed for cough. (Patient not taking: Reported on 10/22/2022)   No facility-administered medications prior to visit.      Objective    BP 106/67 (BP Location: Left Arm, Patient Position: Sitting, Cuff Size: Large)   Pulse 95   Ht 5\' 1"  (1.549 m)   Wt 243 lb 6.4 oz (110.4 kg)   BMI 45.99 kg/m   Physical Exam  Extensive area of  redness and induration of abdominal pannus with scant drainage. By patient report area is about half the size as it was last week.   Assessment & Plan     Assessment and Plan    Skin Abscess: Large abscess on the abdomen that spontaneously drained. Improving since starting septra DS 6 days ago. The patient reported a fever of 102 the day before it started draining. Culture positive for Morganella sensitive to sulfa.  -Refill Septra for additional 10 days -Follow up surgery later this week as schedule for further management of recurrent skin abscesses. -Recommend use of Hibiclens soap 2-3 times a week to reduce bacterial count on the skin.  -Follow-up previously scheduled appointment with dermatology on September 4th.      No follow-ups on file.      Mila Merry, MD  [facility] [phone] (phone) [phone] (fax)  [facility]

## 2022-10-22 NOTE — Patient Instructions (Signed)
.   Please review the attached list of medications and notify my office if there are any errors.   . Please bring all of your medications to every appointment so we can make sure that our medication list is the same as yours.   

## 2022-10-24 ENCOUNTER — Ambulatory Visit: Payer: Managed Care, Other (non HMO) | Admitting: Family Medicine

## 2022-10-26 ENCOUNTER — Encounter: Payer: Self-pay | Admitting: Surgery

## 2022-10-26 ENCOUNTER — Ambulatory Visit: Payer: Managed Care, Other (non HMO) | Admitting: Surgery

## 2022-10-26 VITALS — BP 136/78 | HR 98 | Temp 98.1°F | Ht 61.0 in | Wt 243.0 lb

## 2022-10-26 DIAGNOSIS — L02211 Cutaneous abscess of abdominal wall: Secondary | ICD-10-CM

## 2022-10-26 MED ORDER — OXYCODONE HCL 5 MG PO TABS: 5.0000 mg | ORAL_TABLET | Freq: Four times a day (QID) | ORAL | 0 refills | Status: DC | PRN

## 2022-10-26 NOTE — Patient Instructions (Signed)
If you have any concerns or questions, please feel free to call our office. See follow up appointment.   Wound Packing  Wound packing usually involves placing a moistened packing material into your wound and then covering it with an outer bandage (dressing). This helps support the healing of deep tissue and tissue under the skin. It also helps prevent bleeding, infection, and further injury. Wounds are packed until deep tissues heal. The time it takes for this to happen is different for everyone. Your health care provider will show you how to pack and dress your wound. Using gloves and a clean technique is important to avoid spreading germs into your wound. Supplies needed: Soap and water. Disposable gloves. Cleansing or wetting solution, such as saline, germ-free (sterile) water, or an antiseptic solution. Clean bowl. Clean packing material, such as gauze, gauze sponges, or rolled gauze. Clean paper towels. Outer dressing. This includes the cover dressing and tape, or a dressing with an adhesive border. Cotton-tipped swabs. Small plastic bag for trash. How to pack your wound Follow your health care provider's instructions on how often you need to change dressings and pack your wound. You will likely be asked to change your dressings 1 to 2 times a day. Preparing to change the wound packing If needed, take pain medicine 30 minutes before you pack your wound as told by your health care provider. Preparing the new packing material  Clean and disinfect your work surface or countertop. Set a plastic bag on or near your work surface. Wash your hands with soap and water for at least 20 seconds before you change the dressing. If soap and water are not available, use hand sanitizer. Put a clean paper towel on the counter. Put a clean bowl on the towel. Only touch the outside of the bowl when handling it. Pour the cleansing or wetting solution that your health care provider tells you to use into  the bowl. Select and cut your packing material to fit the size of your wound. Avoid using multiple pieces of packing material. Drop it into the bowl. Cut tape strips that you will use to seal the outer dressing, if needed. Put gauze pads for cleansing and cotton-tipped swabs on the clean paper towel. Removing the old packing material and dressing Put on a set of gloves. Gently remove the old dressing and packing material. Make sure to check how the drainage looks or if there is any odor. Clean or rinse (irrigate) the wound. Remove your gloves. Put the removed items, including gloves, into the plastic bag to throw away later. Wash your hands again with soap and water for at least 20 seconds. If soap and water are not available, use hand sanitizer. Applying the new packing material and dressing  Put on a new set of gloves. Squeeze the packing material in the bowl to release the extra liquid. The packing material should be moist, but not dripping wet. Gently place the packing material into the wound. Use a cotton-tipped swab to guide it into place, filling all of the space. Do not overpack the wound bed. Dry your gloved fingertips on the paper towel. Open up your outer dressing supplies and put them on a dry part of the paper towel. Keep them from getting wet. Place the outer dressing over the packed wound. Tape the edges of the outer dressing in place. Remove your gloves. Wash your hands again with soap and water for at least 20 seconds. If soap and water are not available,  use hand sanitizer. Put the removed items, including gloves, into the plastic bag to throw away. Clean and disinfect your work surface or countertop. General tips Follow your health care provider's instructions on how much to pack the wound. At first, you may need to pack it more fully to help stop bleeding. As the wound begins to heal inside, you will use less packing material and pack the wound loosely to allow the tissue  to heal slowly from the inside out. Do not take baths, swim, or use a hot tub until your health care provider approves. Ask your health care provider if you may take showers. You may only be allowed to take sponge baths. Keep the dressing clean and dry. Follow any other instructions given by your health care provider on how to aid healing. This may include applying warm or cold compresses, raising (elevating) the affected area, or wearing a compression dressing. Check your wound site every day for signs of infection. Check for: More redness, swelling, or pain. More fluid or blood. Warmth or hardness (induration). Pus or a bad smell. Protect your wound from the sun when you are outside for the first 6 months, or for as long as told by your health care provider. Cover up the scar area or apply sunscreen that has an SPF of at least 30. Keep all follow-up visits. This is important. Contact a health care provider if: Your pain is not controlled with pain medicine. You have more drainage, redness, swelling, or pain at your wound site. You have new rash, warmth, or induration around the wound. You have a fever or chills. Your wound becomes larger or deeper. Get help right away if: The tissue inside your wound changes color from pink to white, yellow, or black. You notice a bad smell or pus coming from the wound site. You are having trouble packing your wound. Your wound is bleeding, and the bleeding does not stop with gentle pressure. These symptoms may represent a serious problem that is an emergency. Do not wait to see if the symptoms will go away. Get medical help right away. Call your local emergency services (911 in the [place] Do not drive yourself to the hospital. Summary Wound packing usually involves placing a moistened packing material into your wound and then covering it with an outer bandage (dressing). Follow your health care provider's instructions on how often you need to change  dressings and pack your wound. You will likely be asked to change dressings 1 to 2 times a day. When packing your wound, it is important to use gloves to avoid spreading germs into the wound. Check your wound site every day for signs of infection. This information is not intended to replace advice given to you by your health care provider. Make sure you discuss any questions you have with your health care provider. Document Revised: 07/19/2020 Document Reviewed: 07/19/2020 Elsevier Patient Education  2024 [employer]   Incision and Drainage, Care After After incision and drainage, it is common to have: Pain or discomfort around the incision site. Blood, fluid, or pus (drainage) from the incision. Redness and firm skin around the incision site. Follow these instructions at home: Medicines Take over-the-counter and prescription medicines only as told by your health care provider. If you were prescribed antibiotics, take them as told by your provider. Do not stop using the antibiotic even if you start to feel better. Do not apply creams, ointments, or liquids unless you have been told to by your  provider. Wound care Follow instructions from your provider about how to take care of your wound. Make sure you: Wash your hands with soap and water for at least 20 seconds before and after you change your bandage (dressing). If soap and water are not available, use hand sanitizer. Change your dressing and any packing as told by your provider. If the dressing is dry or stuck when you try to remove it, moisten or wet it with saline or water. This will help you remove it without harming your skin or tissues. If your wound is packed, leave it in place until your provider tells you to remove it. To remove it, moisten or wet the packing with saline or water. Leave stitches (sutures), skin glue, or tape strips in place. These skin closures may need to stay in place for 2 weeks or longer. If tape strip edges  start to loosen and curl up, you may trim the loose edges. Do not remove tape strips completely unless your provider tells you to do that. Check your wound every day for signs of infection. Check for: More redness, swelling, or pain. More fluid or blood. Warmth. Pus or a bad smell. If you were sent home with a drain tube in place, follow instructions from your provider about: How to empty it. How to care for it at home. Be careful when you get rid of used dressings, wound packing, or drainage. Activity Rest the affected area. Return to your normal activities as told by your provider. Ask your provider what activities are safe for you. General instructions Do not use any products that contain nicotine or tobacco. These products include cigarettes, chewing tobacco, and vaping devices, such as e-cigarettes. These can delay incision healing after surgery. If you need help quitting, ask your provider. Do not take baths, swim, or use a hot tub until your provider approves. Ask your provider if you may take showers. You may only be allowed to take sponge baths. The incision will keep draining. It is normal to have some clear or slightly bloody drainage. The amount of drainage should go down each day. Keep all follow-up visits. Your provider will need to make sure that your incision is healing well and that there are no problems. Your health care provider may give you more instructions. Make sure you know what you can and cannot do Contact a health care provider if: Your cyst or abscess comes back. You have any signs of infection. You notice red streaks that spread away from the incision site. You have a fever or chills. Get help right away if: You have severe pain or bleeding. You become short of breath. You have chest pain. You have signs of a severe infection. You may notice changes in your incision area, such as: Swelling that makes the skin feel hard. Numbness or tingling. Sudden increase  in redness. Your skin color may change from red to purple, and then to dark spots. Blisters, ulcers, or splitting of the skin. These symptoms may be an emergency. Get help right away. Call 911. Do not wait to see if the symptoms will go away. Do not drive yourself to the hospital. This information is not intended to replace advice given to you by your health care provider. Make sure you discuss any questions you have with your health care provider. Document Revised: 10/30/2021 Document Reviewed: 10/30/2021 Elsevier Patient Education  2024 [employer].

## 2022-10-26 NOTE — Progress Notes (Signed)
10/26/2022  Reason for Visit: Abdominal wall abscess  Requesting Provider: Merita Norton, FNP  History of Present Illness: Kendra Anderson is a 52 y.o. female presenting for evaluation of abdominal wall abscess.  Patient reports that she has had this for about 2 weeks and initially was getting larger and becoming more tender.  She saw Ms. Payne on 10/16/2022 and was started on Bactrim DS course.  Cultures of the wound were also obtained showing Morganella morganii sensitive to Bactrim.  The patient reports that after that, it started draining spontaneously on its own.  She saw Dr. Sherrie Mustache on 10/22/2022 at which time the patient reported that the area was getting smaller and better.  Her Bactrim course was extended as a precaution.  Today, the patient reports that she still continues to have drainage but the pain has been getting better.  The drainage still is foul-smelling and happens more towards the end of the day after she has been sitting or moving at work all day.  She did have a fever of 102 the day before the abscess started draining.  After that her fever subsided.  Past Medical History: Past Medical History:  Diagnosis Date   Anxiety    Chronic kidney disease    Depression    mild, no meds   Diabetes mellitus with nephropathy (HCC)    Family history of breast cancer 10/2016   [facility] genetic testing letter sent   GERD (gastroesophageal reflux disease)    Hyperlipidemia    Hypertension    Hypothyroidism (acquired)    IBS (irritable bowel syndrome)    Morbid obesity (HCC)    Tobacco abuse      Past Surgical History: Past Surgical History:  Procedure Laterality Date   CHOLECYSTECTOMY  2005   COLONOSCOPY  2011   Dr. Mechele Collin   COLONOSCOPY WITH PROPOFOL N/A 07/08/2017   Procedure: COLONOSCOPY WITH PROPOFOL;  Surgeon: Scot Jun, MD;  Location: [facility] ENDOSCOPY;  Service: Endoscopy;  Laterality: N/A;   COLONOSCOPY WITH PROPOFOL N/A 12/07/2020   Procedure: COLONOSCOPY WITH  PROPOFOL;  Surgeon: Toledo, Boykin Nearing, MD;  Location: [facility] ENDOSCOPY;  Service: Gastroenterology;  Laterality: N/A;  IDDM   kidney stones removed  2012    Home Medications: Prior to Admission medications   Medication Sig Start Date End Date Taking? Authorizing Provider  amLODipine (NORVASC) 10 MG tablet Take 10 mg by mouth daily.  07/05/14  Yes [provider]  atorvastatin (LIPITOR) 40 MG tablet Take 40 mg by mouth daily. 10/20/19  Yes [provider]  colestipol (COLESTID) 1 g tablet Take 1 g by mouth 2 (two) times daily.   Yes [provider]  enalapril (VASOTEC) 10 MG tablet Take 10 mg by mouth daily.  07/05/14  Yes [provider]  glucose blood test strip USE 4 TIMES DAILY 01/20/16  Yes [provider]  HUMALOG 100 UNIT/ML injection  08/16/16  Yes [provider]  Insulin Human (INSULIN PUMP) SOLN Inject into the skin.   Yes [provider]  insulin lispro (HUMALOG) 100 UNIT/ML injection Inject 100 Units into the skin continuous.   Yes [provider]  Insulin Pen Needle 31G X 8 MM MISC USE ONCE DAILY WITH VICTOZA INJECTION 02/28/15  Yes [provider]  LANCETS MICRO THIN 33G MISC Use 1 each 5 (five) times daily. 02/28/15  Yes [provider]  levothyroxine (SYNTHROID, LEVOTHROID) 112 MCG tablet Take 112 mcg by mouth daily before breakfast.  05/31/14  Yes [provider]  medroxyPROGESTERone Acetate 150 MG/ML SUSY ADMINISTER 1 ML(150 MG) IN THE MUSCLE EVERY 3 MONTHS 12/21/20  Yes Conard Novak, MD  metFORMIN (GLUCOPHAGE) 1000 MG tablet Take 1,000 mg by mouth 2 (two) times daily with a meal.  05/31/14  Yes [provider]  Multiple Vitamin (MULTI-VITAMINS) TABS Take 1 tablet by mouth daily.   Yes [provider]  oxyCODONE (ROXICODONE) 5 MG immediate release tablet Take 1 tablet (5 mg total) by mouth every 6 (six) hours as needed. 10/26/22 10/26/23 Yes , Elita Quick, MD  Semaglutide,  1 MG/DOSE, (OZEMPIC, 1 MG/DOSE,) 2 MG/1.5ML SOPN Ozempic 1 mg/dose (2 mg/1.5 mL) subcutaneous pen injector 05/13/19  Yes [provider]  sulfamethoxazole-trimethoprim (BACTRIM DS) 800-160 MG tablet Take 1 tablet by mouth 2 (two) times daily for 10 days. 10/22/22 11/01/22 Yes Malva Limes, MD  traMADol (ULTRAM) 50 MG tablet Take 50 mg by mouth every 6 (six) hours as needed. 11/02/19  Yes [provider]    Allergies: Allergies  Allergen Reactions   Isometheptene-Dichloral-Apap Hives    MIDRIN    Social History:  reports that she has been smoking cigarettes. She has a 3 pack-year smoking history. She has never used smokeless tobacco. She reports that she does not drink alcohol and does not use drugs.   Family History: Family History  Problem Relation Age of Onset   Diabetes Mother    Cancer Mother 71       started in gallbladder and mets to liver and bone   Diabetes Father    Stroke Father 72   Diabetes Sister    Lung cancer Maternal Grandfather 47   Heart disease Paternal Grandfather    Breast cancer Other        paternal great aunts x 5    Review of Systems: Review of Systems  Constitutional:  Positive for fever. Negative for chills.  HENT:  Negative for hearing loss.   Respiratory:  Negative for shortness of breath.   Cardiovascular:  Negative for chest pain.  Gastrointestinal:  Negative for nausea and vomiting.  Skin:        Abdominal wall abscess with drainage    Physical Exam BP 136/78   Pulse 98   Temp 98.1 F (36.7 C) (Oral)   Ht 5\' 1"  (1.549 m)   Wt 243 lb (110.2 kg)   SpO2 95%   BMI 45.91 kg/m  CONSTITUTIONAL: No acute distress HEENT:  Normocephalic, atraumatic, extraocular motion intact. RESPIRATORY:  Normal respiratory effort without pathologic use of accessory muscles. CARDIOVASCULAR: Regular rhythm and rate MUSCULOSKELETAL:  Normal muscle strength and tone in all four extremities.  No peripheral edema or cyanosis. SKIN: The patient  has at the inferior portion of her abdominal wall pannus an area that is indurated and firm measuring about 5 cm in size with a central punctate wound which is currently draining purulent fluid which is foul-smelling.  I attempted to probe this opening with a Q-tip but the head of the Q-tip is too large for it.  Some mild erythema in this area of induration as well but is minimal. NEUROLOGIC:  Motor and sensation is grossly normal.  Cranial nerves are grossly intact. PSYCH:  Alert and oriented to person, place and time. Affect is normal.  Laboratory Analysis: No results found for this or any previous visit (from the past 24 hour(s)).  Imaging: No results found.  Assessment and Plan: This is a 52 y.o. female with an abdominal wall abscess.  -  Discussed with the patient that indeed she has an abdominal wall abscess and currently although it is draining a little bit of this purulent fluid, I do think that she needs an incision and drainage procedure in order to drain this abscess better.  I think the reason that is not healing well yet is that the wound is so small there is no letting all the infection drain out appropriately.  She is in agreement. - Discussed with her the plan for incision and drainage of the abdominal wall abscess and reviewed the procedure at length with her including the risks of bleeding, need for further procedures, packing of the wound, dressing changes, pain control, and she's willing to proceed.   Procedure Date:  10/26/2022  Pre-operative Diagnosis: Abdominal wall abscess  Post-operative Diagnosis: Abdominal wall abscess  Procedure:  Incision and Drainage of abdominal wall abscess  Surgeon:  Howie Ill, MD  Anesthesia: 10 mL of 1% lidocaine with epi  Estimated Blood Loss: 5 ml  Specimens: None  Complications: None  Indications for Procedure:  This is a 52 y.o. female with diagnosis of an abdominal wall abscess, requiring drainage procedure.  The risks  of bleeding, abscess or infection, injury to surrounding structures, and need for further procedures were all discussed with the patient and was willing to proceed.  Description of Procedure: The patient was correctly identified at bedside.  Appropriate time-outs were performed prior to procedure.  The patient's lower abdominal pannus was prepped and draped in usual sterile fashion.  Local anesthetic was infused intradermally.  A 1 cm incision was made over the abscess incorporating the punctate wound, revealing purulent fluid.  Small Kelly forceps were used to dissect around the abscess tissue to open any remaining pockets of purulent fluid.  Overall the cavity extended for about a 3 cm in depth and surrounding the area of the puncture wound.  After drainage was completed, the cavity was irrigated and cleaned.  The wound was packed with 1/2 inch iodoform gauze and covered with dry gauze and tape.  The patient tolerated the procedure well and all sharps were appropriately disposed of at the end of the case.  - Patient may shower tomorrow.  Discussed dressing changes to be timed with her showers. - Instructed the patient how to do the packing dressing changes.  The packing itself can be changed once daily and the outer gauze can be changed as needed to keep the area clean and dry. - May take Tylenol and ibuprofen for pain control.  Will also give a prescription for oxycodone as a precaution for worst pain issues. - The patient still has about a week left of antibiotics from her last course.  Discussed with her continuing this course until completed. - Follow-up in 10 days for wound check.    I spent 30 minutes dedicated to the care of this patient on the date of this encounter to include pre-visit review of records, face-to-face time with the patient discussing diagnosis and management, and any post-visit coordination of care.   Howie Ill, MD [facility]

## 2022-10-31 ENCOUNTER — Ambulatory Visit: Payer: Managed Care, Other (non HMO) | Admitting: Family Medicine

## 2022-11-05 ENCOUNTER — Encounter: Payer: Self-pay | Admitting: Surgery

## 2022-11-05 ENCOUNTER — Ambulatory Visit (INDEPENDENT_AMBULATORY_CARE_PROVIDER_SITE_OTHER): Payer: Managed Care, Other (non HMO) | Admitting: Surgery

## 2022-11-05 VITALS — BP 117/78 | HR 90 | Temp 98.2°F | Ht 61.0 in | Wt 249.0 lb

## 2022-11-05 DIAGNOSIS — L02211 Cutaneous abscess of abdominal wall: Secondary | ICD-10-CM

## 2022-11-05 DIAGNOSIS — Z09 Encounter for follow-up examination after completed treatment for conditions other than malignant neoplasm: Secondary | ICD-10-CM

## 2022-11-05 NOTE — Progress Notes (Signed)
11/05/2022  HPI: Kendra Anderson is a 52 y.o. female s/p I&D of lower abdominal wall abscess on 10/26/22.  Patient presents for follow up.  Reports she's been feeling better, with still some discomfort.  The amount of packing needing is less, still notices drainage on the gauze.  Vital signs: BP 117/78   Pulse 90   Temp 98.2 F (36.8 C)   Ht 5\' 1"  (1.549 m)   Wt 249 lb (112.9 kg)   SpO2 95%   BMI 47.05 kg/m    Physical Exam: Constitutional:  No acute distress Abdomen:  soft, obese, non-distended.  Lower abdominal I&D site is healing appropriately.  There is still induration palpation at the superior portion of the abscess cavity.  When probing with qtip, there was an area superiorly/posteriorly that was trying to seal up without appropriate drainage.  This resulted in release of small volume of purulent fluid.  Abscess cavity was further probed to make sure no other areas that were trying to seal up too quickly.  Packed with 1/4 inch iodoform gauze.  Assessment/Plan: This is a 52 y.o. female s/p I&D of lower abdominal wall abscess.  --Discussed with the patient that there was an area that was not getting appropriately packed as it was trying to seal up already.  This was opened and fluid expressed.  Instructed how to aim the 1/4 inch gauze when packing to assure that the cavity is well drained and cleaned. --Continue dressing changes.  At this point, no other antibiotic is needed, but we'll keep monitoring. --Follow up in two weeks.   Howie Ill, MD [facility]

## 2022-11-05 NOTE — Patient Instructions (Signed)
Continue to pack the area everyday. Be sure to fill all areas with the packing strip. Cover with gauze and tape in place. The wound track goes to your right.   We will see you back here in 2 weeks. Let us know if the area seems to be getting worse or you develop a fever over 100.

## 2022-11-19 ENCOUNTER — Telehealth: Payer: Self-pay | Admitting: Surgery

## 2022-11-19 ENCOUNTER — Ambulatory Visit: Payer: Managed Care, Other (non HMO) | Admitting: Surgery

## 2022-11-19 ENCOUNTER — Ambulatory Visit (INDEPENDENT_AMBULATORY_CARE_PROVIDER_SITE_OTHER): Payer: Managed Care, Other (non HMO) | Admitting: Surgery

## 2022-11-19 ENCOUNTER — Encounter: Payer: Self-pay | Admitting: Surgery

## 2022-11-19 VITALS — BP 130/80 | HR 89 | Temp 98.7°F | Ht 61.0 in | Wt 245.6 lb

## 2022-11-19 DIAGNOSIS — L02211 Cutaneous abscess of abdominal wall: Secondary | ICD-10-CM

## 2022-11-19 NOTE — H&P (View-Only) (Signed)
11/19/2022  History of Present Illness: Kendra Anderson is a 52 y.o. female s/p I&D of abdominal wall abscess on 10/26/2022.  She was seen on 11/05/2022 at which time the wound still appeared indurated and we had to probe the wound more with a Q-tip to improve the drainage.  She now presents today for follow-up and reports that the area is still hard and still having some drainage.  They are able to pack the wound better compared to the previous visit but the firmness and swelling has not improved.  Patient denies any worsening pain, fevers, chills.  Past Medical History: Past Medical History:  Diagnosis Date   Anxiety    Chronic kidney disease    Depression    mild, no meds   Diabetes mellitus with nephropathy (HCC)    Family history of breast cancer 10/2016   BRCA genetic testing letter sent   GERD (gastroesophageal reflux disease)    Hyperlipidemia    Hypertension    Hypothyroidism (acquired)    IBS (irritable bowel syndrome)    Morbid obesity (HCC)    Tobacco abuse      Past Surgical History: Past Surgical History:  Procedure Laterality Date   CHOLECYSTECTOMY  2005   COLONOSCOPY  2011   Dr. Mechele Collin   COLONOSCOPY WITH PROPOFOL N/A 07/08/2017   Procedure: COLONOSCOPY WITH PROPOFOL;  Surgeon: Scot Jun, MD;  Location: [facility] ENDOSCOPY;  Service: Endoscopy;  Laterality: N/A;   COLONOSCOPY WITH PROPOFOL N/A 12/07/2020   Procedure: COLONOSCOPY WITH PROPOFOL;  Surgeon: Toledo, Boykin Nearing, MD;  Location: [facility] ENDOSCOPY;  Service: Gastroenterology;  Laterality: N/A;  IDDM   kidney stones removed  2012    Home Medications: Prior to Admission medications   Medication Sig Start Date End Date Taking? Authorizing Provider  amLODipine (NORVASC) 10 MG tablet Take 10 mg by mouth daily.  07/05/14  Yes [provider]  atorvastatin (LIPITOR) 40 MG tablet Take 40 mg by mouth daily. 10/20/19  Yes [provider]  colestipol (COLESTID) 1 g tablet Take 1 g by mouth 2 (two)  times daily.   Yes [provider]  enalapril (VASOTEC) 10 MG tablet Take 10 mg by mouth daily.  07/05/14  Yes [provider]  glucose blood test strip USE 4 TIMES DAILY 01/20/16  Yes [provider]  HUMALOG 100 UNIT/ML injection  08/16/16  Yes [provider]  Insulin Human (INSULIN PUMP) SOLN Inject into the skin.   Yes [provider]  insulin lispro (HUMALOG) 100 UNIT/ML injection Inject 100 Units into the skin continuous.   Yes [provider]  Insulin Pen Needle 31G X 8 MM MISC USE ONCE DAILY WITH VICTOZA INJECTION 02/28/15  Yes [provider]  LANCETS MICRO THIN 33G MISC Use 1 each 5 (five) times daily. 02/28/15  Yes [provider]  levothyroxine (SYNTHROID, LEVOTHROID) 112 MCG tablet Take 112 mcg by mouth daily before breakfast.  05/31/14  Yes [provider]  medroxyPROGESTERone Acetate 150 MG/ML SUSY ADMINISTER 1 ML(150 MG) IN THE MUSCLE EVERY 3 MONTHS 12/21/20  Yes Conard Novak, MD  metFORMIN (GLUCOPHAGE) 1000 MG tablet Take 1,000 mg by mouth 2 (two) times daily with a meal.  05/31/14  Yes [provider]  Multiple Vitamin (MULTI-VITAMINS) TABS Take 1 tablet by mouth daily.   Yes [provider]  Semaglutide, 1 MG/DOSE, (OZEMPIC, 1 MG/DOSE,) 2 MG/1.5ML SOPN Ozempic 1 mg/dose (2 mg/1.5 mL) subcutaneous pen injector 05/13/19  Yes [provider]  traMADol (ULTRAM) 50 MG tablet Take 50 mg by mouth every 6 (six) hours as needed. 11/02/19  Yes [provider]    Allergies: Allergies  Allergen Reactions   Isometheptene-Dichloral-Apap Hives    MIDRIN    Review of Systems: Review of Systems  Constitutional:  Negative for chills and fever.  Respiratory:  Negative for shortness of breath.   Cardiovascular:  Negative for chest pain.  Gastrointestinal:  Negative for abdominal pain, nausea and vomiting.  Skin:        Persistent firmness in the lower abdominal wall     Physical Exam BP 130/80   Pulse 89   Temp 98.7 F (37.1 C) (Oral)   Ht 5\' 1"  (1.549 m)   Wt 245 lb 9.6 oz (111.4 kg)   SpO2 96%   BMI 46.41 kg/m  CONSTITUTIONAL: No acute distress HEENT:  Normocephalic, atraumatic, extraocular motion intact. RESPIRATORY:  Normal respiratory effort without pathologic use of accessory muscles. CARDIOVASCULAR: Regular rhythm and rate GI: The abdomen is soft, obese, nondistended.  Patient still has an area of firmness in the lower abdominal wall at the site of prior I&D which measures about 3 cm in size.  On the superior portion of this area, there is a 2 cm area of fluctuance which does not appear to be too deep and with only some minimal erythema.  When probing the prior I&D site with a Q-tip, this does not reach the area of fluctuance from within.  No purulent drainage noted.  NEUROLOGIC:  Motor and sensation is grossly normal.  Cranial nerves are grossly intact. PSYCH:  Alert and oriented to person, place and time. Affect is normal.   Assessment and Plan: This is a 52 y.o. female with a nonhealing abdominal wall abscess.  - Discussed with patient that we for reattempt an I&D procedure in the office as well as reprobing the wound last time to allow it to drain better.  However there is still an area in the superior portion that remains undrained and a new area of fluctuance is developed.  Discussed with the patient that we will need to do a repeat I&D of this area but I think given that there is still persistent induration and firmness of the abdominal wall, it would be best to do this under anesthesia in the operating room so we can be more thorough and extensive with this drainage and debridement so that we can fully resolve this issue.  The patient is in agreement. - Discussed with her that the plan for incision and drainage/debridement of the abdominal wall abscess.  Reviewed the surgery at length with her including the planned incision, risks of  bleeding, infection, injury to surrounding structures, need for further procedures, dressing changes, pain control, and she is willing to proceed. - For now, continue packing the wound with quarter inch iodoform gauze as she has been doing.  No new antibiotic is needed at this point either.  We will schedule her for surgery on 11/21/2022.  All of her questions have been answered.  I spent 20 minutes dedicated to the care of this patient on the date of this encounter to include pre-visit review of records, face-to-face time with the patient discussing diagnosis and management, and any post-visit coordination of care.   Howie Ill, MD [facility]

## 2022-11-19 NOTE — Telephone Encounter (Signed)
Patient has been advised of Pre-Admission date/time, and Surgery date at [facility].  Surgery Date: 11/21/22 Preadmission Testing Date: 11/20/22 (phone 8a-1p)  Patient has been made aware to call [phone], between 1-3:00pm the day before surgery, to find out what time to arrive for surgery.

## 2022-11-19 NOTE — Progress Notes (Signed)
11/19/2022  History of Present Illness: Kendra Anderson is a 52 y.o. female s/p I&D of abdominal wall abscess on 10/26/2022.  She was seen on 11/05/2022 at which time the wound still appeared indurated and we had to probe the wound more with a Q-tip to improve the drainage.  She now presents today for follow-up and reports that the area is still hard and still having some drainage.  They are able to pack the wound better compared to the previous visit but the firmness and swelling has not improved.  Patient denies any worsening pain, fevers, chills.  Past Medical History: Past Medical History:  Diagnosis Date   Anxiety    Chronic kidney disease    Depression    mild, no meds   Diabetes mellitus with nephropathy (HCC)    Family history of breast cancer 10/2016   BRCA genetic testing letter sent   GERD (gastroesophageal reflux disease)    Hyperlipidemia    Hypertension    Hypothyroidism (acquired)    IBS (irritable bowel syndrome)    Morbid obesity (HCC)    Tobacco abuse      Past Surgical History: Past Surgical History:  Procedure Laterality Date   CHOLECYSTECTOMY  2005   COLONOSCOPY  2011   Dr. Mechele Collin   COLONOSCOPY WITH PROPOFOL N/A 07/08/2017   Procedure: COLONOSCOPY WITH PROPOFOL;  Surgeon: Scot Jun, MD;  Location: [facility] ENDOSCOPY;  Service: Endoscopy;  Laterality: N/A;   COLONOSCOPY WITH PROPOFOL N/A 12/07/2020   Procedure: COLONOSCOPY WITH PROPOFOL;  Surgeon: Toledo, Boykin Nearing, MD;  Location: [facility] ENDOSCOPY;  Service: Gastroenterology;  Laterality: N/A;  IDDM   kidney stones removed  2012    Home Medications: Prior to Admission medications   Medication Sig Start Date End Date Taking? Authorizing Provider  amLODipine (NORVASC) 10 MG tablet Take 10 mg by mouth daily.  07/05/14  Yes [provider]  atorvastatin (LIPITOR) 40 MG tablet Take 40 mg by mouth daily. 10/20/19  Yes [provider]  colestipol (COLESTID) 1 g tablet Take 1 g by mouth 2 (two)  times daily.   Yes [provider]  enalapril (VASOTEC) 10 MG tablet Take 10 mg by mouth daily.  07/05/14  Yes [provider]  glucose blood test strip USE 4 TIMES DAILY 01/20/16  Yes [provider]  HUMALOG 100 UNIT/ML injection  08/16/16  Yes [provider]  Insulin Human (INSULIN PUMP) SOLN Inject into the skin.   Yes [provider]  insulin lispro (HUMALOG) 100 UNIT/ML injection Inject 100 Units into the skin continuous.   Yes [provider]  Insulin Pen Needle 31G X 8 MM MISC USE ONCE DAILY WITH VICTOZA INJECTION 02/28/15  Yes [provider]  LANCETS MICRO THIN 33G MISC Use 1 each 5 (five) times daily. 02/28/15  Yes [provider]  levothyroxine (SYNTHROID, LEVOTHROID) 112 MCG tablet Take 112 mcg by mouth daily before breakfast.  05/31/14  Yes [provider]  medroxyPROGESTERone Acetate 150 MG/ML SUSY ADMINISTER 1 ML(150 MG) IN THE MUSCLE EVERY 3 MONTHS 12/21/20  Yes Conard Novak, MD  metFORMIN (GLUCOPHAGE) 1000 MG tablet Take 1,000 mg by mouth 2 (two) times daily with a meal.  05/31/14  Yes [provider]  Multiple Vitamin (MULTI-VITAMINS) TABS Take 1 tablet by mouth daily.   Yes [provider]  Semaglutide, 1 MG/DOSE, (OZEMPIC, 1 MG/DOSE,) 2 MG/1.5ML SOPN Ozempic 1 mg/dose (2 mg/1.5 mL) subcutaneous pen injector 05/13/19  Yes [provider]  traMADol (ULTRAM) 50 MG tablet Take 50 mg by mouth every 6 (six) hours as needed. 11/02/19  Yes [provider]    Allergies: Allergies  Allergen Reactions   Isometheptene-Dichloral-Apap Hives    MIDRIN    Review of Systems: Review of Systems  Constitutional:  Negative for chills and fever.  Respiratory:  Negative for shortness of breath.   Cardiovascular:  Negative for chest pain.  Gastrointestinal:  Negative for abdominal pain, nausea and vomiting.  Skin:        Persistent firmness in the lower abdominal wall     Physical Exam BP 130/80   Pulse 89   Temp 98.7 F (37.1 C) (Oral)   Ht 5\' 1"  (1.549 m)   Wt 245 lb 9.6 oz (111.4 kg)   SpO2 96%   BMI 46.41 kg/m  CONSTITUTIONAL: No acute distress HEENT:  Normocephalic, atraumatic, extraocular motion intact. RESPIRATORY:  Normal respiratory effort without pathologic use of accessory muscles. CARDIOVASCULAR: Regular rhythm and rate GI: The abdomen is soft, obese, nondistended.  Patient still has an area of firmness in the lower abdominal wall at the site of prior I&D which measures about 3 cm in size.  On the superior portion of this area, there is a 2 cm area of fluctuance which does not appear to be too deep and with only some minimal erythema.  When probing the prior I&D site with a Q-tip, this does not reach the area of fluctuance from within.  No purulent drainage noted.  NEUROLOGIC:  Motor and sensation is grossly normal.  Cranial nerves are grossly intact. PSYCH:  Alert and oriented to person, place and time. Affect is normal.   Assessment and Plan: This is a 52 y.o. female with a nonhealing abdominal wall abscess.  - Discussed with patient that we for reattempt an I&D procedure in the office as well as reprobing the wound last time to allow it to drain better.  However there is still an area in the superior portion that remains undrained and a new area of fluctuance is developed.  Discussed with the patient that we will need to do a repeat I&D of this area but I think given that there is still persistent induration and firmness of the abdominal wall, it would be best to do this under anesthesia in the operating room so we can be more thorough and extensive with this drainage and debridement so that we can fully resolve this issue.  The patient is in agreement. - Discussed with her that the plan for incision and drainage/debridement of the abdominal wall abscess.  Reviewed the surgery at length with her including the planned incision, risks of  bleeding, infection, injury to surrounding structures, need for further procedures, dressing changes, pain control, and she is willing to proceed. - For now, continue packing the wound with quarter inch iodoform gauze as she has been doing.  No new antibiotic is needed at this point either.  We will schedule her for surgery on 11/21/2022.  All of her questions have been answered.  I spent 20 minutes dedicated to the care of this patient on the date of this encounter to include pre-visit review of records, face-to-face time with the patient discussing diagnosis and management, and any post-visit coordination of care.   Howie Ill, MD [facility]

## 2022-11-19 NOTE — Addendum Note (Signed)
Addended by: Myrtie Hawk on: 11/19/2022 04:47 PM   Modules accepted: Orders

## 2022-11-19 NOTE — Patient Instructions (Signed)
Our surgery scheduler Britta Mccreedy will call you within 24-48 hours to get you scheduled. If you have not heard from her after 48 hours, please call our office. Have the blue sheet available when she calls to write down important information.  If you have any concerns or questions, please feel free to call our office.   Excision of Skin Lesions Excision of a skin lesion is the removal of a section of skin by making small incisions in the skin. Through this process, the lesion is completely removed. This procedure is often done to treat or prevent cancer or infection. It may also be done to improve cosmetic appearance. You may have this procedure to remove: Cancerous (malignant) growths, such as basal cell carcinoma, squamous cell carcinoma, or melanoma. Noncancerous (benign) growths, such as a cyst or lipoma. Growths, such as moles or skin tags, which may be removed for cosmetic reasons. Various excision or surgical techniques may be used depending on your condition, the location of the lesion, and your overall health. Tell your health care provider about: Any allergies you have. All medicines you are taking, including vitamins, herbs, eye drops, creams, and over-the-counter medicines. Any problems you or family members have had with anesthetic medicines. Any bleeding problems you have. Any surgeries you have had. Any medical conditions you have. Whether you are pregnant or may be pregnant. What are the risks? Generally, this is a safe procedure. However, problems may occur, including: Bleeding. Infection. Scarring. Recurrence of the cyst, lipoma, or cancer. Allergic reaction to anesthetics, surgical materials, or ointments. Damage to nerves, blood vessels, muscles, or other structures. What happens before the procedure? Medicines Ask your health care provider about: Changing or stopping your regular medicines. This is especially important if you are taking diabetes medicines or blood  thinners. Taking medicines such as aspirin and ibuprofen. These medicines can thin your blood. Do not take these medicines unless your health care provider tells you to take them. Taking over-the-counter medicines, vitamins, herbs, and supplements. General instructions Do not use any products that contain nicotine or tobacco. These products include cigarettes, chewing tobacco, and vaping devices, such as e-cigarettes. If you need help quitting, ask your health care provider. Follow instructions from your health care provider about eating or drinking restrictions. Ask your health care provider: How your surgery site will be marked. What steps will be taken to help prevent infection. These steps may include: Removing hair at the surgery site. Washing skin with a germ-killing soap. Taking antibiotic medicine. Ask your health care provider if you will need someone to take you home from the hospital or clinic after the procedure. What happens during the procedure?  You will be given a medicine to numb the area (local anesthetic). Your health care provider will remove the lesions using one of the following excision techniques. Complete surgical excision. This procedure may be done to treat a cancerous growth or a noncancerous cyst or lesion. A small scalpel or scissors will be used to gently cut around and under the lesion until it is completely removed. If bleeding occurs, it will be stopped with a device that delivers heat (electrocautery). The edges of the wound may be stitched (sutured) together. A bandage (dressing) will be applied. Samples will be sent to a lab for testing. Excision of a cyst. An incision will be made on the cyst. The entire cyst will be removed through the incision. The incision may be closed with sutures. Shave excision. This may be done to remove a mole  or other small growths. A small blade or scalpel will be used to shave off the lesion. The wound is usually left to  heal on its own without sutures. The sample may be sent to a lab for testing. Punch excision. This may be done to completely remove a mole or other small growths. A small tool that is like a cookie cutter or a hole punch is used to cut a circle shape out of the skin. The outer edges of the skin will be sutured together. The sample may be sent to a lab for testing. Mohs micrographic surgery. This is usually done to treat skin cancer. This type of excision is mostly used on the face and ears. This procedure is minimally invasive, and it ensures the best cosmetic outcome. A scalpel or a loop instrument will be used to remove layers of the lesion until all the abnormal or cancerous tissue has been removed. The wound may be sutured, depending on its size. The tissue will be checked under a microscope right away. The procedure may vary among health care providers and hospitals. At the end of any of these procedures, antibiotic ointment will be applied as needed. What happens after the procedure? Talk with your health care provider to discuss any test results, treatment options, and if necessary, the need for more tests. Keep all follow-up visits. This is important. Summary Excision of a skin lesion is the removal of a section of skin by making small incisions in the skin. This procedure is often done to treat or prevent skin cancer, remove benign growths, or it may be done to improve cosmetic appearance. Various excision or surgical techniques may be used depending on your condition, the location of the lesion, and your overall health. After the procedure, talk with your health care provider to discuss any test results, treatment options, and if necessary, the need for more tests. Keep all follow-up visits. This is important. This information is not intended to replace advice given to you by your health care provider. Make sure you discuss any questions you have with your health care provider. Document  Revised: 10/11/2020 Document Reviewed: 10/11/2020 Elsevier Patient Education  2024 [employer].

## 2022-11-20 ENCOUNTER — Encounter
Admission: RE | Admit: 2022-11-20 | Discharge: 2022-11-20 | Disposition: A | Discharge status: Home / Self Care | Payer: Managed Care, Other (non HMO) | Source: Ambulatory Visit | Attending: Surgery | Admitting: Surgery

## 2022-11-20 ENCOUNTER — Encounter: Payer: Self-pay | Admitting: Urgent Care

## 2022-11-20 ENCOUNTER — Other Ambulatory Visit: Payer: Self-pay

## 2022-11-20 DIAGNOSIS — Z794 Long term (current) use of insulin: Secondary | ICD-10-CM | POA: Insufficient documentation

## 2022-11-20 DIAGNOSIS — I152 Hypertension secondary to endocrine disorders: Secondary | ICD-10-CM | POA: Insufficient documentation

## 2022-11-20 DIAGNOSIS — E1122 Type 2 diabetes mellitus with diabetic chronic kidney disease: Secondary | ICD-10-CM

## 2022-11-20 DIAGNOSIS — Z01812 Encounter for preprocedural laboratory examination: Secondary | ICD-10-CM

## 2022-11-20 DIAGNOSIS — E1159 Type 2 diabetes mellitus with other circulatory complications: Secondary | ICD-10-CM | POA: Diagnosis not present

## 2022-11-20 DIAGNOSIS — Z01810 Encounter for preprocedural cardiovascular examination: Secondary | ICD-10-CM | POA: Insufficient documentation

## 2022-11-20 DIAGNOSIS — Z01818 Encounter for other preprocedural examination: Secondary | ICD-10-CM | POA: Diagnosis present

## 2022-11-20 DIAGNOSIS — N189 Chronic kidney disease, unspecified: Secondary | ICD-10-CM | POA: Diagnosis not present

## 2022-11-20 HISTORY — DX: Cellulitis of abdominal wall: L03.311

## 2022-11-20 HISTORY — DX: Proteinuria, unspecified: R80.9

## 2022-11-20 LAB — BASIC METABOLIC PANEL
Anion gap: 8 (ref 5–15)
BUN: 17 mg/dL (ref 6–20)
CO2: 18 mmol/L — ABNORMAL LOW (ref 22–32)
Calcium: 9.2 mg/dL (ref 8.9–10.3)
Chloride: 113 mmol/L — ABNORMAL HIGH (ref 98–111)
Creatinine, Ser: 1.2 mg/dL — ABNORMAL HIGH (ref 0.44–1.00)
GFR, Estimated: 55 mL/min — ABNORMAL LOW (ref >60)
Glucose, Bld: 88 mg/dL (ref 70–99)
Potassium: 3.9 mmol/L (ref 3.5–5.1)
Sodium: 139 mmol/L (ref 135–145)

## 2022-11-20 LAB — CBC
HCT: 43.6 % (ref 36.0–46.0)
Hemoglobin: 14.2 g/dL (ref 12.0–15.0)
MCH: 29.6 pg (ref 26.0–34.0)
MCHC: 32.6 g/dL (ref 30.0–36.0)
MCV: 91 fL (ref 80.0–100.0)
Platelets: 325 10*3/uL (ref 150–400)
RBC: 4.79 MIL/uL (ref 3.87–5.11)
RDW: 16 % — ABNORMAL HIGH (ref 11.5–15.5)
WBC: 12.4 10*3/uL — ABNORMAL HIGH (ref 4.0–10.5)
nRBC: 0 % (ref 0.0–0.2)

## 2022-11-20 MED ORDER — BUPIVACAINE LIPOSOME 1.3 % IJ SUSP: 20.0000 mL | Freq: Once | INTRAMUSCULAR | Status: DC

## 2022-11-20 MED ORDER — CHLORHEXIDINE GLUCONATE 0.12 % MT SOLN
15.0000 mL | Freq: Once | OROMUCOSAL | Status: AC
  Administered 2022-11-21: 15 mL via OROMUCOSAL

## 2022-11-20 MED ORDER — GABAPENTIN 300 MG PO CAPS
300.0000 mg | ORAL_CAPSULE | ORAL | Status: AC
  Administered 2022-11-21: 300 mg via ORAL

## 2022-11-20 MED ORDER — ACETAMINOPHEN 500 MG PO TABS
1000.0000 mg | ORAL_TABLET | ORAL | Status: AC
  Administered 2022-11-21: 1000 mg via ORAL

## 2022-11-20 MED ORDER — CEFAZOLIN SODIUM-DEXTROSE 2-4 GM/100ML-% IV SOLN
2.0000 g | INTRAVENOUS | Status: AC
  Administered 2022-11-21: 2 g via INTRAVENOUS

## 2022-11-20 MED ORDER — CHLORHEXIDINE GLUCONATE CLOTH 2 % EX PADS
6.0000 | MEDICATED_PAD | Freq: Once | CUTANEOUS | Status: AC
  Administered 2022-11-21: 6 via TOPICAL

## 2022-11-20 MED ORDER — CHLORHEXIDINE GLUCONATE CLOTH 2 % EX PADS: 6.0000 | MEDICATED_PAD | Freq: Once | CUTANEOUS | Status: DC

## 2022-11-20 MED ORDER — FAMOTIDINE 20 MG PO TABS
20.0000 mg | ORAL_TABLET | Freq: Once | ORAL | Status: AC
  Administered 2022-11-21: 20 mg via ORAL

## 2022-11-20 MED ORDER — ORAL CARE MOUTH RINSE: 15.0000 mL | Freq: Once | OROMUCOSAL | Status: AC

## 2022-11-20 MED ORDER — SODIUM CHLORIDE 0.9 % IV SOLN: INTRAVENOUS | Status: DC

## 2022-11-20 NOTE — Patient Instructions (Addendum)
Your procedure is scheduled on: 11/21/2022 Wednesday  Report to the Registration Desk on the 1st floor of the [facility]. To find out your arrival time, please call [phone] between 1PM - 3PM on: 11/20/2022 Tuesday If your arrival time is 6:00 am, do not arrive before that time as the [facility] entrance doors do not open until 6:00 am.  REMEMBER: Instructions that are not followed completely may result in serious medical risk, up to and including death; or upon the discretion of your surgeon and anesthesiologist your surgery may need to be rescheduled.  Do not eat food after midnight the night before surgery.  No gum chewing or hard candies.  You may however, drink CLEAR liquids (water)  up to 2 hours before you are scheduled to arrive for your surgery. Do not drink anything within 2 hours of your scheduled arrival time.    One week prior to surgery: Stop Anti-inflammatories (NSAIDS) such as Advil, Aleve, Ibuprofen, Motrin, Naproxen, Naprosyn and Aspirin based products such as Excedrin, Goody's Powder, BC Powder. Stop ANY OVER THE COUNTER supplements until after surgery. You may however, continue to take Tylenol if needed for pain up until the day of surgery.  Continue taking all prescribed medications with the exception of the following: Metformin- hold two days prior to surgery Ozempic- Hold 7 days prior to surgery    Follow recommendations from Cardiologist or PCP regarding stopping blood thinners.  TAKE ONLY THESE MEDICATIONS THE MORNING OF SURGERY WITH A SIP OF WATER:  levothyroxine  amLODipine    No Alcohol for 24 hours before or after surgery.  No Smoking including e-cigarettes for 24 hours before surgery.  No chewable tobacco products for at least 6 hours before surgery.  No nicotine patches on the day of surgery.  Do not use any "recreational" drugs for at least a week (preferably 2 weeks) before your surgery.  Please be advised that the combination of  cocaine and anesthesia may have negative outcomes, up to and including death. If you test positive for cocaine, your surgery will be cancelled.  On the morning of surgery brush your teeth with toothpaste and water, you may rinse your mouth with mouthwash if you wish. Do not swallow any toothpaste or mouthwash.  Use CHG Soap as directed on instruction sheet.  Do not wear jewelry, make-up, hairpins, clips or nail polish.  Do not wear lotions, powders, or perfumes.   Do not shave body hair from the neck down 48 hours before surgery.  Contact lenses, hearing aids and dentures may not be worn into surgery.  Do not bring valuables to the hospital. [facility] is not responsible for any missing/lost belongings or valuables.   Notify your doctor if there is any change in your medical condition (cold, fever, infection).  Wear comfortable clothing (specific to your surgery type) to the hospital.  After surgery, you can help prevent lung complications by doing breathing exercises.  Take deep breaths and cough every 1-2 hours. Your doctor may order a device called an Incentive Spirometer to help you take deep breaths.  If you are being admitted to the hospital overnight, leave your suitcase in the car. After surgery it may be brought to your room.   If you are being discharged the day of surgery, you will not be allowed to drive home. You will need a responsible individual to drive you home and stay with you for 24 hours after surgery.    Please call the Pre-admissions Testing Dept.  at [phone] if you have any questions about these instructions.  Surgery Visitation Policy:  Patients having surgery or a procedure may have two visitors.  Children under the age of 10 must have an adult with them who is not the patient.      Preparing for Surgery with CHLORHEXIDINE GLUCONATE (CHG) Soap  Chlorhexidine Gluconate (CHG) Soap  o An antiseptic cleaner that kills germs and bonds with  the skin to continue killing germs even after washing  o Used for showering the night before surgery and morning of surgery  Before surgery, you can play an important role by reducing the number of germs on your skin.  CHG (Chlorhexidine gluconate) soap is an antiseptic cleanser which kills germs and bonds with the skin to continue killing germs even after washing.  Please do not use if you have an allergy to CHG or antibacterial soaps. If your skin becomes reddened/irritated stop using the CHG.  1. Shower the NIGHT BEFORE SURGERY and the MORNING OF SURGERY with CHG soap.  2. If you choose to wash your hair, wash your hair first as usual with your normal shampoo.  3. After shampooing, rinse your hair and body thoroughly to remove the shampoo.  4. Use CHG as you would any other liquid soap. You can apply CHG directly to the skin and wash gently with a scrungie or a clean washcloth.  5. Apply the CHG soap to your body only from the neck down. Do not use on open wounds or open sores. Avoid contact with your eyes, ears, mouth, and genitals (private parts). Wash face and genitals (private parts) with your normal soap.  6. Wash thoroughly, paying special attention to the area where your surgery will be performed.  7. Thoroughly rinse your body with warm water.  8. Do not shower/wash with your normal soap after using and rinsing off the CHG soap.  9. Pat yourself dry with a clean towel.  10. Wear clean pajamas to bed the night before surgery.  12. Place clean sheets on your bed the night of your first shower and do not sleep with pets.  13. Shower again with the CHG soap on the day of surgery prior to arriving at the hospital.  14. Do not apply any deodorants/lotions/powders.  15. Please wear clean clothes to the hospital.

## 2022-11-20 NOTE — Progress Notes (Signed)
Pre -admit testing interview completed , patient instructions given to patient, however, patient did not remember what's the name of her current insulin. Attempted to reconcile medication based on her ability. Questions answered.

## 2022-11-21 ENCOUNTER — Ambulatory Visit: Payer: Managed Care, Other (non HMO) | Admitting: Surgery

## 2022-11-21 ENCOUNTER — Encounter: Payer: Self-pay | Admitting: Surgery

## 2022-11-21 ENCOUNTER — Ambulatory Visit: Payer: Managed Care, Other (non HMO) | Admitting: Anesthesiology

## 2022-11-21 ENCOUNTER — Other Ambulatory Visit: Payer: Self-pay

## 2022-11-21 ENCOUNTER — Encounter
Admission: RE | Disposition: A | Discharge status: Home / Self Care | Payer: Self-pay | Source: Home / Self Care | Attending: Surgery

## 2022-11-21 ENCOUNTER — Ambulatory Visit
Admission: RE | Admit: 2022-11-21 | Discharge: 2022-11-21 | Disposition: A | Discharge status: Home / Self Care | Payer: Managed Care, Other (non HMO) | Attending: Surgery | Admitting: Surgery

## 2022-11-21 DIAGNOSIS — E039 Hypothyroidism, unspecified: Secondary | ICD-10-CM | POA: Insufficient documentation

## 2022-11-21 DIAGNOSIS — I129 Hypertensive chronic kidney disease with stage 1 through stage 4 chronic kidney disease, or unspecified chronic kidney disease: Secondary | ICD-10-CM | POA: Insufficient documentation

## 2022-11-21 DIAGNOSIS — E1122 Type 2 diabetes mellitus with diabetic chronic kidney disease: Secondary | ICD-10-CM | POA: Diagnosis not present

## 2022-11-21 DIAGNOSIS — N189 Chronic kidney disease, unspecified: Secondary | ICD-10-CM | POA: Insufficient documentation

## 2022-11-21 DIAGNOSIS — L02211 Cutaneous abscess of abdominal wall: Secondary | ICD-10-CM | POA: Insufficient documentation

## 2022-11-21 DIAGNOSIS — Z6841 Body Mass Index (BMI) 40.0 and over, adult: Secondary | ICD-10-CM | POA: Insufficient documentation

## 2022-11-21 DIAGNOSIS — G4730 Sleep apnea, unspecified: Secondary | ICD-10-CM | POA: Diagnosis not present

## 2022-11-21 DIAGNOSIS — Z01812 Encounter for preprocedural laboratory examination: Secondary | ICD-10-CM

## 2022-11-21 DIAGNOSIS — Z794 Long term (current) use of insulin: Secondary | ICD-10-CM

## 2022-11-21 HISTORY — PX: INCISION AND DRAINAGE ABSCESS: SHX5864

## 2022-11-21 LAB — GLUCOSE, CAPILLARY
Glucose-Capillary: 184 mg/dL — ABNORMAL HIGH (ref 70–99)
Glucose-Capillary: 173 mg/dL — ABNORMAL HIGH (ref 70–99)

## 2022-11-21 LAB — POCT PREGNANCY, URINE: Preg Test, Ur: NEGATIVE

## 2022-11-21 SURGERY — INCISION AND DRAINAGE, ABSCESS
Anesthesia: General

## 2022-11-21 MED ORDER — DAKINS (1/4 STRENGTH) 0.125 % EX SOLN
CUTANEOUS | Status: AC
  Filled 2022-11-21: qty 473

## 2022-11-21 MED ORDER — MIDAZOLAM HCL 2 MG/2ML IJ SOLN
INTRAMUSCULAR | Status: DC | PRN
  Administered 2022-11-21: 2 mg via INTRAVENOUS

## 2022-11-21 MED ORDER — MIDAZOLAM HCL 2 MG/2ML IJ SOLN
INTRAMUSCULAR | Status: AC
  Filled 2022-11-21: qty 2

## 2022-11-21 MED ORDER — DROPERIDOL 2.5 MG/ML IJ SOLN
0.6250 mg | Freq: Once | INTRAMUSCULAR | Status: AC | PRN
  Administered 2022-11-21: 0.625 mg via INTRAVENOUS

## 2022-11-21 MED ORDER — DEXAMETHASONE SODIUM PHOSPHATE 10 MG/ML IJ SOLN
INTRAMUSCULAR | Status: AC
  Filled 2022-11-21: qty 1

## 2022-11-21 MED ORDER — FENTANYL CITRATE (PF) 100 MCG/2ML IJ SOLN
INTRAMUSCULAR | Status: DC | PRN
  Administered 2022-11-21: 25 ug via INTRAVENOUS
  Administered 2022-11-21: 50 ug via INTRAVENOUS
  Administered 2022-11-21: 25 ug via INTRAVENOUS

## 2022-11-21 MED ORDER — DEXAMETHASONE SODIUM PHOSPHATE 10 MG/ML IJ SOLN
INTRAMUSCULAR | Status: DC | PRN
  Administered 2022-11-21: 5 mg via INTRAVENOUS

## 2022-11-21 MED ORDER — CHLORHEXIDINE GLUCONATE 0.12 % MT SOLN
OROMUCOSAL | Status: AC
  Filled 2022-11-21: qty 15

## 2022-11-21 MED ORDER — GABAPENTIN 300 MG PO CAPS
ORAL_CAPSULE | ORAL | Status: AC
  Filled 2022-11-21: qty 1

## 2022-11-21 MED ORDER — SULFAMETHOXAZOLE-TRIMETHOPRIM 800-160 MG PO TABS: 1.0000 | ORAL_TABLET | Freq: Two times a day (BID) | ORAL | 0 refills | Status: DC

## 2022-11-21 MED ORDER — SUGAMMADEX SODIUM 200 MG/2ML IV SOLN
INTRAVENOUS | Status: DC | PRN
  Administered 2022-11-21: 200 mg via INTRAVENOUS

## 2022-11-21 MED ORDER — ONDANSETRON HCL 4 MG/2ML IJ SOLN
INTRAMUSCULAR | Status: DC | PRN
Start: 2022-11-21 — End: 2022-11-21
  Administered 2022-11-21: 4 mg via INTRAVENOUS

## 2022-11-21 MED ORDER — BUPIVACAINE-EPINEPHRINE (PF) 0.5% -1:200000 IJ SOLN
INTRAMUSCULAR | Status: AC
  Filled 2022-11-21: qty 30

## 2022-11-21 MED ORDER — SUCCINYLCHOLINE CHLORIDE 200 MG/10ML IV SOSY
PREFILLED_SYRINGE | INTRAVENOUS | Status: AC
  Filled 2022-11-21: qty 10

## 2022-11-21 MED ORDER — LIDOCAINE HCL (PF) 2 % IJ SOLN
INTRAMUSCULAR | Status: AC
  Filled 2022-11-21: qty 5

## 2022-11-21 MED ORDER — OXYCODONE HCL 5 MG PO TABS: 5.0000 mg | ORAL_TABLET | Freq: Four times a day (QID) | ORAL | 0 refills | Status: DC | PRN

## 2022-11-21 MED ORDER — LIDOCAINE-EPINEPHRINE (PF) 1 %-1:200000 IJ SOLN
INTRAMUSCULAR | Status: AC
  Filled 2022-11-21: qty 30

## 2022-11-21 MED ORDER — DROPERIDOL 2.5 MG/ML IJ SOLN
INTRAMUSCULAR | Status: AC
  Filled 2022-11-21: qty 2

## 2022-11-21 MED ORDER — PROPOFOL 10 MG/ML IV BOLUS
INTRAVENOUS | Status: AC
  Filled 2022-11-21: qty 20

## 2022-11-21 MED ORDER — BUPIVACAINE-EPINEPHRINE 0.5% -1:200000 IJ SOLN
INTRAMUSCULAR | Status: DC | PRN
  Administered 2022-11-21: 30 mL

## 2022-11-21 MED ORDER — ROCURONIUM BROMIDE 10 MG/ML (PF) SYRINGE
PREFILLED_SYRINGE | INTRAVENOUS | Status: AC
  Filled 2022-11-21: qty 10

## 2022-11-21 MED ORDER — FENTANYL CITRATE (PF) 100 MCG/2ML IJ SOLN
INTRAMUSCULAR | Status: AC
  Filled 2022-11-21: qty 2

## 2022-11-21 MED ORDER — ACETAMINOPHEN 500 MG PO TABS
ORAL_TABLET | ORAL | Status: AC
  Filled 2022-11-21: qty 2

## 2022-11-21 MED ORDER — BUPIVACAINE LIPOSOME 1.3 % IJ SUSP
INTRAMUSCULAR | Status: AC
  Filled 2022-11-21: qty 10

## 2022-11-21 MED ORDER — FENTANYL CITRATE (PF) 100 MCG/2ML IJ SOLN: 25.0000 ug | INTRAMUSCULAR | Status: DC | PRN

## 2022-11-21 MED ORDER — BUPIVACAINE LIPOSOME 1.3 % IJ SUSP
INTRAMUSCULAR | Status: DC | PRN
  Administered 2022-11-21: 10 mL

## 2022-11-21 MED ORDER — PROPOFOL 10 MG/ML IV BOLUS
INTRAVENOUS | Status: DC | PRN
  Administered 2022-11-21: 50 mg via INTRAVENOUS
  Administered 2022-11-21: 130 mg via INTRAVENOUS

## 2022-11-21 MED ORDER — FAMOTIDINE 20 MG PO TABS
ORAL_TABLET | ORAL | Status: AC
  Filled 2022-11-21: qty 1

## 2022-11-21 MED ORDER — ONDANSETRON HCL 4 MG/2ML IJ SOLN
INTRAMUSCULAR | Status: AC
  Filled 2022-11-21: qty 2

## 2022-11-21 MED ORDER — IBUPROFEN 600 MG PO TABS: 600.0000 mg | ORAL_TABLET | Freq: Three times a day (TID) | ORAL | 1 refills | Status: AC | PRN

## 2022-11-21 MED ORDER — LIDOCAINE HCL (CARDIAC) PF 100 MG/5ML IV SOSY
PREFILLED_SYRINGE | INTRAVENOUS | Status: DC | PRN
  Administered 2022-11-21: 100 mg via INTRAVENOUS

## 2022-11-21 MED ORDER — ROCURONIUM BROMIDE 100 MG/10ML IV SOLN
INTRAVENOUS | Status: DC | PRN
  Administered 2022-11-21: 40 mg via INTRAVENOUS

## 2022-11-21 MED ORDER — CEFAZOLIN SODIUM-DEXTROSE 2-4 GM/100ML-% IV SOLN
INTRAVENOUS | Status: AC
  Filled 2022-11-21: qty 100

## 2022-11-21 SURGICAL SUPPLY — 40 items
APL PRP STRL LF DISP 70% ISPRP (MISCELLANEOUS)
BNDG GAUZE DERMACEA FLUFF 4 (GAUZE/BANDAGES/DRESSINGS) ×1 IMPLANT
BNDG GZE 12X3 1 PLY HI ABS (GAUZE/BANDAGES/DRESSINGS) ×1
BNDG GZE DERMACEA 4 6PLY (GAUZE/BANDAGES/DRESSINGS) ×3
BNDG STRETCH GAUZE 3IN X12FT (GAUZE/BANDAGES/DRESSINGS) IMPLANT
CHLORAPREP W/TINT 26 (MISCELLANEOUS) ×1 IMPLANT
CNTNR URN SCR LID CUP LEK RST (MISCELLANEOUS) ×1 IMPLANT
CONT SPEC 4OZ STRL OR WHT (MISCELLANEOUS)
DRAIN PENROSE 12X.25 LTX STRL (MISCELLANEOUS) ×1 IMPLANT
DRAPE LAPAROTOMY 77X122 PED (DRAPES) ×1 IMPLANT
DRSG GAUZE FLUFF 36X18 (GAUZE/BANDAGES/DRESSINGS) ×1 IMPLANT
ELECT CAUTERY BLADE TIP 2.5 (TIP) ×1
ELECT REM PT RETURN 9FT ADLT (ELECTROSURGICAL) ×1
ELECTRODE CAUTERY BLDE TIP 2.5 (TIP) ×1 IMPLANT
ELECTRODE REM PT RTRN 9FT ADLT (ELECTROSURGICAL) ×1 IMPLANT
GAUZE 4X4 16PLY ~~LOC~~+RFID DBL (SPONGE) ×1 IMPLANT
GAUZE PACKING IODOFORM 2 (PACKING) IMPLANT
GAUZE SPONGE 4X4 12PLY STRL (GAUZE/BANDAGES/DRESSINGS) ×1 IMPLANT
GLOVE SURG SYN 7.0 (GLOVE) ×2 IMPLANT
GLOVE SURG SYN 7.0 PF PI (GLOVE) ×1 IMPLANT
GLOVE SURG SYN 7.5 E (GLOVE) ×1 IMPLANT
GLOVE SURG SYN 7.5 PF PI (GLOVE) ×1 IMPLANT
GOWN STRL REUS W/ TWL LRG LVL3 (GOWN DISPOSABLE) ×2 IMPLANT
GOWN STRL REUS W/TWL LRG LVL3 (GOWN DISPOSABLE) ×2
KIT TURNOVER KIT A (KITS) ×1 IMPLANT
LABEL OR SOLS (LABEL) ×1 IMPLANT
MANIFOLD NEPTUNE II (INSTRUMENTS) ×1 IMPLANT
NDL HYPO 22X1.5 SAFETY MO (MISCELLANEOUS) ×1 IMPLANT
NEEDLE HYPO 22X1.5 SAFETY MO (MISCELLANEOUS) ×1 IMPLANT
NS IRRIG 500ML POUR BTL (IV SOLUTION) ×1 IMPLANT
PACK BASIN MINOR ARMC (MISCELLANEOUS) ×1 IMPLANT
PAD ABD DERMACEA PRESS 5X9 (GAUZE/BANDAGES/DRESSINGS) ×1 IMPLANT
RETRACTOR TRAXI PANNICULUS (MISCELLANEOUS) IMPLANT
SOL PREP PVP 2OZ (MISCELLANEOUS) ×1
SOLUTION PREP PVP 2OZ (MISCELLANEOUS) ×1 IMPLANT
SPONGE T-LAP 18X18 ~~LOC~~+RFID (SPONGE) ×2 IMPLANT
SWAB CULTURE AMIES ANAERIB BLU (MISCELLANEOUS) ×2 IMPLANT
SYR 20ML LL LF (SYRINGE) ×1 IMPLANT
TRAP FLUID SMOKE EVACUATOR (MISCELLANEOUS) ×1 IMPLANT
WATER STERILE IRR 500ML POUR (IV SOLUTION) ×1 IMPLANT

## 2022-11-21 NOTE — Transfer of Care (Signed)
Immediate Anesthesia Transfer of Care Note  Patient: Kendra Anderson  Procedure(s) Performed: INCISION AND DRAINAGE ABSCESS, abdominal wall  Patient Location: PACU  Anesthesia Type:General  Level of Consciousness: drowsy and patient cooperative  Airway & Oxygen Therapy: Patient Spontanous Breathing  Post-op Assessment: Report given to RN and Post -op Vital signs reviewed and stable  Post vital signs: Reviewed and stable  Last Vitals:  Vitals Value Taken Time  BP 127/72 11/21/22 1120  Temp 36.1 C 11/21/22 1120  Pulse 79 11/21/22 1127  Resp 28 11/21/22 1127  SpO2 99 % 11/21/22 1127  Vitals shown include unfiled device data.  Last Pain:  Vitals:   11/21/22 1120  TempSrc: Temporal  PainSc: 0-No pain         Complications: No notable events documented.

## 2022-11-21 NOTE — Discharge Instructions (Addendum)
Discharge instructions: 1.  Patient may shower, but do not scrub wounds heavily and dab dry only. 2.  Do not submerge wounds in pool/tub until fully healed. 3.  May apply ice packs to the wounds for comfort. 4.  Please change dressing once daily as instructed. 5.  Due to residual infection, will give you additional antibiotic course to take.  Dressing change: Pack the wound cavity with moistened Kerlix/roll gauze, trim when packing is flush with the skin.  Then cover with 4x4 gauze and/or ABD pad, and secure with tape.  Please change once daily.  The outer gauze may be changed more frequently depending on fluid saturation/soilage.AMBULATORY SURGERY  DISCHARGE INSTRUCTIONS   The drugs that you were given will stay in your system until tomorrow so for the next 24 hours you should not:  Drive an automobile Make any legal decisions Drink any alcoholic beverage   You may resume regular meals tomorrow.  Today it is better to start with liquids and gradually work up to solid foods.  You may eat anything you prefer, but it is better to start with liquids, then soup and crackers, and gradually work up to solid foods.   Please notify your doctor immediately if you have any unusual bleeding, trouble breathing, redness and pain at the surgery site, drainage, fever, or pain not relieved by medication.    Additional Instructions:        Please contact your physician with any problems or Same Day Surgery at [phone], Monday through Friday 6 am to 4 pm, or [place] at [facility] number at [phone].

## 2022-11-21 NOTE — Anesthesia Preprocedure Evaluation (Signed)
Anesthesia Evaluation  Patient identified by MRN, date of birth, ID band Patient awake    Reviewed: Allergy & Precautions, NPO status , Patient's Chart, lab work & pertinent test results  History of Anesthesia Complications Negative for: history of anesthetic complications  Airway Mallampati: II  TM Distance: <3 FB Neck ROM: full    Dental  (+) Chipped, Dental Advidsory Given, Caps, Teeth Intact Permament bridge on the top right:   Pulmonary neg shortness of breath, sleep apnea (based on history) , neg COPD, neg recent URI, Current Smoker and Patient abstained from smoking.   Pulmonary exam normal        Cardiovascular Exercise Tolerance: Good hypertension, (-) angina (-) Past MI, (-) Cardiac Stents and (-) DOE Normal cardiovascular exam(-) dysrhythmias (-) Valvular Problems/Murmurs     Neuro/Psych  PSYCHIATRIC DISORDERS Anxiety Depression    negative neurological ROS     GI/Hepatic Neg liver ROS,GERD  Medicated and Controlled,,  Endo/Other  diabetes, Type 2Hypothyroidism  Morbid obesity  Renal/GU Renal disease  negative genitourinary   Musculoskeletal   Abdominal   Peds  Hematology negative hematology ROS (+)   Anesthesia Other Findings Past Medical History: No date: Anxiety No date: Chronic kidney disease No date: Depression     Comment:  mild, no meds No date: Diabetes mellitus with nephropathy (HCC) 10/2016: Family history of breast cancer     Comment:  BRCA genetic testing letter sent No date: GERD (gastroesophageal reflux disease) No date: Hyperlipidemia No date: Hypertension No date: Hypothyroidism (acquired) No date: IBS (irritable bowel syndrome) No date: Morbid obesity (HCC) No date: Tobacco abuse  Past Surgical History: 2005: CHOLECYSTECTOMY 2011: COLONOSCOPY     Comment:  Dr. Mechele Collin 07/08/2017: COLONOSCOPY WITH PROPOFOL; N/A     Comment:  Procedure: COLONOSCOPY WITH PROPOFOL;  Surgeon:  Scot Jun, MD;  Location: [facility] ENDOSCOPY;  Service:               Endoscopy;  Laterality: N/A; 2012: kidney stones removed  BMI    Body Mass Index: 51.96 kg/m      Reproductive/Obstetrics negative OB ROS                             Anesthesia Physical Anesthesia Plan  ASA: 3  Anesthesia Plan: General   Post-op Pain Management:    Induction: Intravenous  PONV Risk Score and Plan: 3 and Ondansetron, Dexamethasone, Midazolam and Treatment may vary due to age or medical condition  Airway Management Planned: Oral ETT  Additional Equipment:   Intra-op Plan:   Post-operative Plan: Extubation in OR  Informed Consent: I have reviewed the patients History and Physical, chart, labs and discussed the procedure including the risks, benefits and alternatives for the proposed anesthesia with the patient or authorized representative who has indicated his/her understanding and acceptance.     Dental Advisory Given  Plan Discussed with: Anesthesiologist, CRNA and Surgeon  Anesthesia Plan Comments: (Patient consented for risks of anesthesia including but not limited to:  - adverse reactions to medications - risk of airway placement if required - damage to eyes, teeth, lips or other oral mucosa - nerve damage due to positioning  - sore throat or hoarseness - Damage to heart, brain, nerves, lungs, other parts of body or loss of life  Patient voiced understanding.)        Anesthesia Quick Evaluation

## 2022-11-21 NOTE — Anesthesia Procedure Notes (Signed)
Procedure Name: Intubation Date/Time: 11/21/2022 10:19 AM  Performed by: Omer Jack, CRNAPre-anesthesia Checklist: Patient identified, Patient being monitored, Timeout performed, Emergency Drugs available and Suction available Patient Re-evaluated:Patient Re-evaluated prior to induction Oxygen Delivery Method: Circle system utilized Preoxygenation: Pre-oxygenation with 100% oxygen Induction Type: IV induction Ventilation: Mask ventilation without difficulty Laryngoscope Size: Mac and 3 Grade View: Grade I Tube type: Oral Tube size: 7.0 mm Number of attempts: 1 Placement Confirmation: ETT inserted through vocal cords under direct vision, positive ETCO2 and breath sounds checked- equal and bilateral Secured at: 21 cm Tube secured with: Tape Dental Injury: Teeth and Oropharynx as per pre-operative assessment

## 2022-11-21 NOTE — Anesthesia Postprocedure Evaluation (Signed)
Anesthesia Post Note  Patient: Kendra Anderson  Procedure(s) Performed: INCISION AND DRAINAGE ABSCESS, abdominal wall  Patient location during evaluation: PACU Anesthesia Type: General Level of consciousness: awake and alert Pain management: pain level controlled Vital Signs Assessment: post-procedure vital signs reviewed and stable Respiratory status: spontaneous breathing, nonlabored ventilation, respiratory function stable and patient connected to nasal cannula oxygen Cardiovascular status: blood pressure returned to baseline and stable Postop Assessment: no apparent nausea or vomiting Anesthetic complications: no   No notable events documented.   Last Vitals:  Vitals:   11/21/22 1145 11/21/22 1210  BP: 126/69 137/81  Pulse: 76 78  Resp: 13 20  Temp:  (!) 36.1 C  SpO2: 96% 96%    Last Pain:  Vitals:   11/21/22 1210  TempSrc: Temporal  PainSc: 0-No pain                 Lenard Simmer

## 2022-11-21 NOTE — Interval H&P Note (Signed)
History and Physical Interval Note:  11/21/2022 10:01 AM  Kendra Anderson  has presented today for surgery, with the diagnosis of abdominal wall abscess 4 cm.  The various methods of treatment have been discussed with the patient and family. After consideration of risks, benefits and other options for treatment, the patient has consented to  Procedure(s): INCISION AND DRAINAGE ABSCESS, abdominal wall (N/A) as a surgical intervention.  The patient's history has been reviewed, patient examined, no change in status, stable for surgery.  I have reviewed the patient's chart and labs.  Questions were answered to the patient's satisfaction.     Caleigha Zale

## 2022-11-21 NOTE — Op Note (Addendum)
  Procedure Date:  11/21/2022  Pre-operative Diagnosis:  Non-healing abdominal wall abscess  Post-operative Diagnosis: Non-healing abdominal wall abscess  Procedure:  Incision, drainage, and debridement of abdominal wall abscess including skin and subcutaneous tissue for size 3 x 3 cm = 9 cm2.  Surgeon:  Howie Ill, MD  Anesthesia:  General endotracheal  Estimated Blood Loss:  10 ml  Specimens:   Culture swab Tissue culture  Complications:  None  Findings:  The patient's abscess was deep and there was a residual piece of 1/2 inch iodoform gauze noted.  Indications for Procedure:  This is a 52 y.o. female with diagnosis of an abdominal wall abscess, which has not healed despite of prior I&D in the office and antibiotic course.  She now presents for drainage and excision.  The risks of bleeding, abscess or infection, injury to surrounding structures, and need for further procedures were all discussed with the patient and was willing to proceed.  Description of Procedure: The patient was correctly identified in the preoperative area and brought into the operating room.  The patient was placed supine with VTE prophylaxis in place.  Appropriate time-outs were performed.  Anesthesia was induced and the patient was intubated.  Appropriate antibiotics were infused.  The patient's lower abdominal wall was prepped and draped in usual sterile fashion.  A 3 cm incision was made over the prior I&D site and abscess cavity.  This revealed a small amount of residual purulent fluid.  This was swabbed for culture and sent to micro.  The cavity was noted to be deep, and cautery was then used to create a 3 cm circular cut incorporating the abscess cavity, going deep within the subcutaneous tissue for about 6 cm.  This allowed [place] to excise the entire abscess cavity including the indurated subcutaneous tissue.  In doing this, a 2 cm length of 1/2 inch iodoform was found within the abscess cavity, likely  also contributing to the infection.  This was removed.  After drainage was completed, the cavity was irrigated and cleaned.  Local anesthetic was infused intradermally.   The wound was packed with small Kerlix gauze and covered with dry gauze and tape.  The patient was then emerged from anesthesia, extubated, and brought to the recovery room for further management.  The patient tolerated the procedure well and all counts were correct at the end of the case.   Howie Ill, MD

## 2022-11-22 ENCOUNTER — Encounter: Payer: Self-pay | Admitting: Surgery

## 2022-11-24 ENCOUNTER — Other Ambulatory Visit: Payer: Self-pay | Admitting: Surgery

## 2022-11-24 ENCOUNTER — Encounter: Payer: Self-pay | Admitting: Surgery

## 2022-11-24 MED ORDER — AMOXICILLIN-POT CLAVULANATE 875-125 MG PO TABS: 1.0000 | ORAL_TABLET | Freq: Two times a day (BID) | ORAL | 0 refills | Status: DC

## 2022-11-26 LAB — AEROBIC/ANAEROBIC CULTURE W GRAM STAIN (SURGICAL/DEEP WOUND)
Gram Stain: NONE SEEN
Gram Stain: NONE SEEN

## 2022-12-06 ENCOUNTER — Encounter: Payer: Self-pay | Admitting: Physician Assistant

## 2022-12-06 ENCOUNTER — Ambulatory Visit: Payer: Managed Care, Other (non HMO) | Admitting: Physician Assistant

## 2022-12-06 VITALS — BP 116/66 | HR 91 | Temp 98.0°F | Ht 61.0 in | Wt 247.4 lb

## 2022-12-06 DIAGNOSIS — Z09 Encounter for follow-up examination after completed treatment for conditions other than malignant neoplasm: Secondary | ICD-10-CM

## 2022-12-06 DIAGNOSIS — L02211 Cutaneous abscess of abdominal wall: Secondary | ICD-10-CM

## 2022-12-06 NOTE — Progress Notes (Signed)
[facility] POST-OP OFFICE VISIT  12/06/2022  HPI: Kendra Anderson is a 52 y.o. female 15 days s/p incision, drainage, and debridement of non-healing abdominal wall abscess, total area 3 x 3 cm, with Dr Aleen Campi   She is doing well Friend is helping with packing changes No erythema or drainage No significant pain No other complaints   Vital signs: BP 116/66   Pulse 91   Temp 98 F (36.7 C) (Oral)   Ht 5\' 1"  (1.549 m)   Wt 247 lb 6.4 oz (112.2 kg)   SpO2 98%   BMI 46.75 kg/m    Physical Exam: Constitutional: Well appearing female, NAD Skin: 3 x 3 x 4 cm wound to the suprapubic abdomen/pannus, would bed appears to be 100% granulation tissue, no erythema, no drainage. Slight maceration to the surrounding skin from tape  Assessment/Plan: This is a 52 y.o. female 15 days s/p incision, drainage, and debridement of non-healing abdominal wall abscess, total area 3 x 3 cm, with Dr Aleen Campi    - Reviewed wound care recommendation: Continue packing daily with saline moistened gauze, cover, secure. Switched to Medipore tape to try and ease wear on skin.   - I will be happy to continue seeing her every 2-3 weeks for wound checks; She understands to call with questions/concerns in the interim   -- Lynden Oxford, PA-C [facility] 12/06/2022, 2:32 PM M-F: 7am - 4pm

## 2022-12-06 NOTE — Patient Instructions (Signed)
Incision and Drainage, Care After After incision and drainage, it is common to have: Pain or discomfort around the incision site. Blood, fluid, or pus (drainage) from the incision. Redness and firm skin around the incision site. Follow these instructions at home: Medicines Take over-the-counter and prescription medicines only as told by your health care provider. If you were prescribed antibiotics, take them as told by your provider. Do not stop using the antibiotic even if you start to feel better. Do not apply creams, ointments, or liquids unless you have been told to by your provider. Wound care Follow instructions from your provider about how to take care of your wound. Make sure you: Wash your hands with soap and water for at least 20 seconds before and after you change your bandage (dressing). If soap and water are not available, use hand sanitizer. Change your dressing and any packing as told by your provider. If the dressing is dry or stuck when you try to remove it, moisten or wet it with saline or water. This will help you remove it without harming your skin or tissues. If your wound is packed, leave it in place until your provider tells you to remove it. To remove it, moisten or wet the packing with saline or water. Leave stitches (sutures), skin glue, or tape strips in place. These skin closures may need to stay in place for 2 weeks or longer. If tape strip edges start to loosen and curl up, you may trim the loose edges. Do not remove tape strips completely unless your provider tells you to do that. Check your wound every day for signs of infection. Check for: More redness, swelling, or pain. More fluid or blood. Warmth. Pus or a bad smell. If you were sent home with a drain tube in place, follow instructions from your provider about: How to empty it. How to care for it at home. Be careful when you get rid of used dressings, wound packing, or drainage. Activity Rest the  affected area. Return to your normal activities as told by your provider. Ask your provider what activities are safe for you. General instructions Do not use any products that contain nicotine or tobacco. These products include cigarettes, chewing tobacco, and vaping devices, such as e-cigarettes. These can delay incision healing after surgery. If you need help quitting, ask your provider. Do not take baths, swim, or use a hot tub until your provider approves. Ask your provider if you may take showers. You may only be allowed to take sponge baths. The incision will keep draining. It is normal to have some clear or slightly bloody drainage. The amount of drainage should go down each day. Keep all follow-up visits. Your provider will need to make sure that your incision is healing well and that there are no problems. Your health care provider may give you more instructions. Make sure you know what you can and cannot do Contact a health care provider if: Your cyst or abscess comes back. You have any signs of infection. You notice red streaks that spread away from the incision site. You have a fever or chills. Get help right away if: You have severe pain or bleeding. You become short of breath. You have chest pain. You have signs of a severe infection. You may notice changes in your incision area, such as: Swelling that makes the skin feel hard. Numbness or tingling. Sudden increase in redness. Your skin color may change from red to purple, and then to dark  spots. Blisters, ulcers, or splitting of the skin. These symptoms may be an emergency. Get help right away. Call 911. Do not wait to see if the symptoms will go away. Do not drive yourself to the hospital. This information is not intended to replace advice given to you by your health care provider. Make sure you discuss any questions you have with your health care provider. Document Revised: 10/30/2021 Document Reviewed: 10/30/2021 Elsevier  Patient Education  2024 [employer].

## 2022-12-20 ENCOUNTER — Encounter: Payer: Managed Care, Other (non HMO) | Admitting: Physician Assistant

## 2022-12-26 ENCOUNTER — Encounter: Payer: Self-pay | Admitting: Physician Assistant

## 2022-12-26 ENCOUNTER — Ambulatory Visit (INDEPENDENT_AMBULATORY_CARE_PROVIDER_SITE_OTHER): Payer: Managed Care, Other (non HMO) | Admitting: Physician Assistant

## 2022-12-26 VITALS — BP 133/84 | HR 96 | Temp 98.2°F | Ht 61.0 in | Wt 252.8 lb

## 2022-12-26 DIAGNOSIS — L02211 Cutaneous abscess of abdominal wall: Secondary | ICD-10-CM

## 2022-12-26 DIAGNOSIS — Z09 Encounter for follow-up examination after completed treatment for conditions other than malignant neoplasm: Secondary | ICD-10-CM | POA: Diagnosis not present

## 2022-12-26 NOTE — Patient Instructions (Signed)
Incision and Drainage, Care After After incision and drainage, it is common to have: Pain or discomfort around the incision site. Blood, fluid, or pus (drainage) from the incision. Redness and firm skin around the incision site. Follow these instructions at home: Medicines Take over-the-counter and prescription medicines only as told by your health care provider. If you were prescribed antibiotics, take them as told by your provider. Do not stop using the antibiotic even if you start to feel better. Do not apply creams, ointments, or liquids unless you have been told to by your provider. Wound care Follow instructions from your provider about how to take care of your wound. Make sure you: Wash your hands with soap and water for at least 20 seconds before and after you change your bandage (dressing). If soap and water are not available, use hand sanitizer. Change your dressing and any packing as told by your provider. If the dressing is dry or stuck when you try to remove it, moisten or wet it with saline or water. This will help you remove it without harming your skin or tissues. If your wound is packed, leave it in place until your provider tells you to remove it. To remove it, moisten or wet the packing with saline or water. Leave stitches (sutures), skin glue, or tape strips in place. These skin closures may need to stay in place for 2 weeks or longer. If tape strip edges start to loosen and curl up, you may trim the loose edges. Do not remove tape strips completely unless your provider tells you to do that. Check your wound every day for signs of infection. Check for: More redness, swelling, or pain. More fluid or blood. Warmth. Pus or a bad smell. If you were sent home with a drain tube in place, follow instructions from your provider about: How to empty it. How to care for it at home. Be careful when you get rid of used dressings, wound packing, or drainage. Activity Rest the  affected area. Return to your normal activities as told by your provider. Ask your provider what activities are safe for you. General instructions Do not use any products that contain nicotine or tobacco. These products include cigarettes, chewing tobacco, and vaping devices, such as e-cigarettes. These can delay incision healing after surgery. If you need help quitting, ask your provider. Do not take baths, swim, or use a hot tub until your provider approves. Ask your provider if you may take showers. You may only be allowed to take sponge baths. The incision will keep draining. It is normal to have some clear or slightly bloody drainage. The amount of drainage should go down each day. Keep all follow-up visits. Your provider will need to make sure that your incision is healing well and that there are no problems. Your health care provider may give you more instructions. Make sure you know what you can and cannot do Contact a health care provider if: Your cyst or abscess comes back. You have any signs of infection. You notice red streaks that spread away from the incision site. You have a fever or chills. Get help right away if: You have severe pain or bleeding. You become short of breath. You have chest pain. You have signs of a severe infection. You may notice changes in your incision area, such as: Swelling that makes the skin feel hard. Numbness or tingling. Sudden increase in redness. Your skin color may change from red to purple, and then to dark  spots. Blisters, ulcers, or splitting of the skin. These symptoms may be an emergency. Get help right away. Call 911. Do not wait to see if the symptoms will go away. Do not drive yourself to the hospital. This information is not intended to replace advice given to you by your health care provider. Make sure you discuss any questions you have with your health care provider. Document Revised: 10/30/2021 Document Reviewed: 10/30/2021 Elsevier  Patient Education  2024 [employer].

## 2022-12-26 NOTE — Progress Notes (Signed)
[facility] POST-OP OFFICE VISIT  12/26/2022  HPI: WALTERINE AMODEI is a 52 y.o. female 35 days s/p incision, drainage, and debridement of non-healing abdominal wall abscess, total area 3 x 3 cm, with Dr Aleen Campi   She is doing well Anxious for this to heal No fever, chills No issues with dressing changes  Vital signs: BP 133/84 (BP Location: Left Arm, Patient Position: Sitting, Cuff Size: Large)   Pulse 96   Temp 98.2 F (36.8 C) (Oral)   Ht 5\' 1"  (1.549 m)   Wt 252 lb 12.8 oz (114.7 kg)   SpO2 95%   BMI 47.77 kg/m    Physical Exam: Constitutional: Well appearing female, NAD Skin: 2 x 0.5 x 1.5 cm wound to the suprapubic abdomen/pannus, would bed appears to be 100% granulation tissue, no erythema, no drainage.  Assessment/Plan: This is a 52 y.o. female 35 days s/p incision, drainage, and debridement of non-healing abdominal wall abscess, total area 3 x 3 cm, with Dr Aleen Campi    - Reviewed wound care recommendation: Continue packing daily with gauze, cover, secure. Switched to simple Band-Aid             - I will be happy to continue seeing her every 3-4 weeks for wound checks; She understands to call with questions/concerns in the interim   -- Lynden Oxford, PA-C [facility] 12/26/2022, 4:04 PM M-F: 7am - 4pm

## 2023-01-30 ENCOUNTER — Ambulatory Visit (INDEPENDENT_AMBULATORY_CARE_PROVIDER_SITE_OTHER): Payer: Managed Care, Other (non HMO) | Admitting: Physician Assistant

## 2023-01-30 ENCOUNTER — Encounter: Payer: Self-pay | Admitting: Physician Assistant

## 2023-01-30 VITALS — BP 113/73 | HR 98 | Temp 98.0°F | Ht 61.0 in | Wt 255.0 lb

## 2023-01-30 DIAGNOSIS — L02211 Cutaneous abscess of abdominal wall: Secondary | ICD-10-CM

## 2023-01-30 DIAGNOSIS — Z09 Encounter for follow-up examination after completed treatment for conditions other than malignant neoplasm: Secondary | ICD-10-CM

## 2023-01-30 NOTE — Patient Instructions (Addendum)
Just keep the area covered with a Band-Aid or dry gauze.   Do not submerge the area until fully closed.     Follow-up with our office as needed.  Please call and ask to speak with a nurse if you develop questions or concerns.

## 2023-01-30 NOTE — Progress Notes (Signed)
[facility] POST-OP OFFICE VISIT  01/30/2023  HPI: Kendra Anderson is a 52 y.o. female ~2 months s/p incision, drainage, and debridement of non-healing abdominal wall abscess, total area 3 x 3 cm, with Dr Aleen Campi   She is doing well Having some discomfort with packing, but suspects this is secondary to wound closing and not having enough room for packing No fever, chills No other complaints   Vital signs: BP 113/73   Pulse 98   Temp 98 F (36.7 C)   Ht 5\' 1"  (1.549 m)   Wt 255 lb (115.7 kg)   SpO2 97%   BMI 48.18 kg/m    Physical Exam: Constitutional: Well appearing female, NAD Skin: 0.5 x 0.5 x 0.0 cm wound to the suprapubic abdomen/pannus, would bed appears to be 100% granulation tissue, no erythema, no drainage   Assessment/Plan: This is a 52 y.o. female ~2 months s/p incision, drainage, and debridement of non-healing abdominal wall abscess, total area 3 x 3 cm, with Dr Aleen Campi    - Pain control prn  - Reviewed wound care recommendation; She no longer needs to pack this wound. She can transition to superficial dressings as needed. Anticipate this will close in next 1-2 weeks.   - She will call if wound fails to close or she notices any issues; We will otherwise follow up as needed   -- Lynden Oxford, PA-C  [facility] 01/30/2023, 1:29 PM M-F: 7am - 4pm

## 2023-02-01 ENCOUNTER — Encounter: Payer: Self-pay | Admitting: Family Medicine

## 2023-02-01 MED ORDER — AMOXICILLIN-POT CLAVULANATE 875-125 MG PO TABS: 1.0000 | ORAL_TABLET | Freq: Two times a day (BID) | ORAL | 0 refills | Status: AC

## 2023-02-01 NOTE — Telephone Encounter (Signed)
Have sent prescription for Augmentin, go to ER if not rapidly improving.

## 2023-02-07 ENCOUNTER — Other Ambulatory Visit: Payer: Self-pay | Admitting: Obstetrics and Gynecology

## 2023-02-07 DIAGNOSIS — Z1231 Encounter for screening mammogram for malignant neoplasm of breast: Secondary | ICD-10-CM

## 2023-05-02 ENCOUNTER — Telehealth: Payer: Managed Care, Other (non HMO) | Admitting: Physician Assistant

## 2023-05-02 ENCOUNTER — Encounter: Payer: Self-pay | Admitting: Physician Assistant

## 2023-05-02 DIAGNOSIS — J069 Acute upper respiratory infection, unspecified: Secondary | ICD-10-CM

## 2023-05-02 MED ORDER — BENZONATATE 100 MG PO CAPS
100.0000 mg | ORAL_CAPSULE | Freq: Two times a day (BID) | ORAL | 0 refills | Status: AC | PRN
Start: 2023-05-02 — End: ?

## 2023-05-02 MED ORDER — ALBUTEROL SULFATE HFA 108 (90 BASE) MCG/ACT IN AERS
2.0000 | INHALATION_SPRAY | Freq: Four times a day (QID) | RESPIRATORY_TRACT | 2 refills | Status: AC | PRN
Start: 2023-05-02 — End: ?

## 2023-05-02 NOTE — Progress Notes (Signed)
 MyChart Video Visit  Virtual Visit via Video Note   This format is felt to be most appropriate for this patient at this time. Physical exam was limited by quality of the video and audio technology used for the visit.   Provider location: Virtual Visit Location Provider: Office/Clinic Patient Location: Home  I discussed the limitations of evaluation and management by telemedicine and the availability of in person appointments. The patient expressed understanding and agreed to proceed.  Patient: Kendra Anderson   DOB: Oct 12, 1970   53 y.o. Female  MRN: [id number] Visit Date: 05/02/2023  Today's healthcare provider: Jolynn Spencer, PA-C   No chief complaint on file.  Subjective     Discussed the use of AI scribe software for clinical note transcription with the patient, who gave verbal consent to proceed.  History of Present Illness   The patient, with a history of diabetes, hypertension, and kidney disease, presents with a head cold, cough, and low-grade fevers for the past two and a half days. She reports that her ears feel clogged and she has been losing her voice. She has been taking over-the-counter Mucinex, but it has not been effective. She notes that similar symptoms in the past have progressed to bronchitis, for which she has used an albuterol  inhaler. She denies any known exposure to flu or COVID-19. She also reports nasal congestion and wheezing, but denies shortness of breath. She has a sore throat and has been experiencing a low-grade fever. She has not been going to work due to her symptoms.      Medications: Outpatient Medications Prior to Visit  Medication Sig   amLODipine (NORVASC) 10 MG tablet Take 10 mg by mouth daily.    atorvastatin (LIPITOR) 40 MG tablet Take 40 mg by mouth daily.   colestipol (COLESTID) 1 g tablet Take 1 g by mouth 2 (two) times daily.   enalapril (VASOTEC) 10 MG tablet Take 10 mg by mouth daily.    glucose blood test strip USE 4 TIMES DAILY    ibuprofen  (ADVIL ) 600 MG tablet Take 1 tablet (600 mg total) by mouth every 8 (eight) hours as needed for moderate pain.   Insulin Human (INSULIN PUMP) SOLN Inject into the skin.   insulin lispro (HUMALOG) 100 UNIT/ML injection Inject 100 Units into the skin continuous.   Insulin Pen Needle 31G X 8 MM MISC USE ONCE DAILY WITH VICTOZA INJECTION   LANCETS MICRO THIN 33G MISC Use 1 each 5 (five) times daily.   levothyroxine (SYNTHROID, LEVOTHROID) 112 MCG tablet Take 112 mcg by mouth daily before breakfast.    medroxyPROGESTERone  Acetate 150 MG/ML SUSY ADMINISTER 1 ML(150 MG) IN THE MUSCLE EVERY 3 MONTHS   metFORMIN (GLUCOPHAGE) 1000 MG tablet Take 1,000 mg by mouth 2 (two) times daily with a meal.    Multiple Vitamin (MULTI-VITAMINS) TABS Take 1 tablet by mouth daily.   Semaglutide, 1 MG/DOSE, (OZEMPIC, 1 MG/DOSE,) 2 MG/1.5ML SOPN Ozempic 1 mg/dose (2 mg/1.5 mL) subcutaneous pen injector   traMADol  (ULTRAM ) 50 MG tablet Take 50 mg by mouth every 6 (six) hours as needed.   No facility-administered medications prior to visit.    Review of Systems All negative  Except see HPI       Objective    There were no vitals taken for this visit.       Physical Exam  Constitutional:      General: She is not in acute distress.    Appearance: Normal appearance.  HENT:  Head: Normocephalic.  Pulmonary:     Effort: Pulmonary effort is normal. No respiratory distress.  Neurological:     Mental Status: She is alert and oriented to person, place, and time. Mental status is at baseline.      Assessment and Plan    Upper Respiratory Infection Symptoms of head cold, clogged ears, cough, and low-grade fevers for the past 2.5 days. History of similar symptoms progressing to bronchitis. No shortness of breath but reports wheezing. -Continue Mucinex DM with dextromethorphan over the counter. -Prescribe Albuterol  as a precaution. -Prescribe Benzonatate  (Tessalon ) for cough. It was helpful in  the past/last year.  -Advise use of nasal saline rinse and Flonase for nasal congestion. -Advise warm salt gargle for sore throat. -Advise increased water intake. -Check in if symptoms persist or worsen after 7-10 days.  In the setting of  Kidney stones Patient is cautious about medication use due to kidney disease. No acute issues discussed. Last GFR 50+ -Advise patient to monitor fluid balance when taking new medications. -Continue current management as per nephrologist.  Diabetes No acute issues discussed. -Continue current management.  Hypertension No acute issues discussed. -Continue current management.  General Health Maintenance -Advise continued use of respiratory mask and hand washing for infection prevention. -Provide work note for absence until Monday, 05/06/2023.     No follow-ups on file.     I discussed the assessment and treatment plan with the patient. The patient was provided an opportunity to ask questions and all were answered. The patient agreed with the plan and demonstrated an understanding of the instructions.   The patient was advised to call back or seek an in-person evaluation if the symptoms worsen or if the condition fails to improve as anticipated.  I, Josh Nicolosi, PA-C have reviewed all documentation for this visit. The documentation on 05/02/2023  for the exam, diagnosis, procedures, and orders are all accurate and complete.  Jolynn Spencer, [facility], MMS [facility] [phone] (phone) [phone] (fax)  [facility]

## 2023-07-18 LAB — HEMOGLOBIN A1C: Hemoglobin A1C: 8.2

## 2023-07-31 ENCOUNTER — Encounter

## 2023-08-27 ENCOUNTER — Ambulatory Visit
Admission: RE | Admit: 2023-08-27 | Discharge: 2023-08-27 | Disposition: A | Discharge status: Home / Self Care | Source: Ambulatory Visit | Attending: Obstetrics and Gynecology | Admitting: Obstetrics and Gynecology

## 2023-08-27 DIAGNOSIS — Z1231 Encounter for screening mammogram for malignant neoplasm of breast: Secondary | ICD-10-CM | POA: Diagnosis present

## 2023-09-11 ENCOUNTER — Ambulatory Visit: Payer: Self-pay | Admitting: Obstetrics and Gynecology
